# Patient Record
Sex: Female | Born: 1990 | Race: Black or African American | Hispanic: No | Marital: Single | Smoking: Never smoker
Health system: Southern US, Community
[De-identification: ages and names within clinical notes are randomized; demographics above are authoritative.]

## PROBLEM LIST (undated history)

## (undated) ENCOUNTER — Inpatient Hospital Stay (HOSPITAL_COMMUNITY): Payer: Self-pay

## (undated) DIAGNOSIS — Z789 Other specified health status: Secondary | ICD-10-CM

## (undated) DIAGNOSIS — E119 Type 2 diabetes mellitus without complications: Secondary | ICD-10-CM

## (undated) DIAGNOSIS — B9689 Other specified bacterial agents as the cause of diseases classified elsewhere: Secondary | ICD-10-CM

## (undated) DIAGNOSIS — N760 Acute vaginitis: Secondary | ICD-10-CM

## (undated) HISTORY — PX: NO PAST SURGERIES: SHX2092

---
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
## 2012-02-01 ENCOUNTER — Emergency Department (HOSPITAL_COMMUNITY)
Admission: EM | Admit: 2012-02-01 | Discharge: 2012-02-01 | Disposition: A | Discharge status: Home / Self Care | Payer: Medicaid Other | Attending: Emergency Medicine | Admitting: Emergency Medicine

## 2012-02-01 ENCOUNTER — Encounter (HOSPITAL_COMMUNITY): Payer: Self-pay | Admitting: Emergency Medicine

## 2012-02-01 DIAGNOSIS — R509 Fever, unspecified: Secondary | ICD-10-CM | POA: Insufficient documentation

## 2012-02-01 DIAGNOSIS — R52 Pain, unspecified: Secondary | ICD-10-CM | POA: Insufficient documentation

## 2012-02-01 DIAGNOSIS — J020 Streptococcal pharyngitis: Secondary | ICD-10-CM

## 2012-02-01 LAB — RAPID STREP SCREEN (MED CTR MEBANE ONLY): Streptococcus, Group A Screen (Direct): POSITIVE — AB

## 2012-02-01 MED ORDER — AMOXICILLIN 500 MG PO CAPS: 500.0000 mg | ORAL_CAPSULE | Freq: Three times a day (TID) | ORAL | Status: DC

## 2012-02-01 NOTE — ED Provider Notes (Signed)
History     CSN: [id number]  Arrival date & time 02/01/12  1929   First MD Initiated Contact with Patient 02/01/12 1947      Chief Complaint  Patient presents with  . Sore Throat  . Generalized Body Aches  . Fever    (Consider location/radiation/quality/duration/timing/severity/associated sxs/prior treatment) HPI Comments: This is a 21 year old female who presents emergency department with chief complaint of fever, sore throat, and body aches since yesterday. Patient states that she has taken DayQuil with little relief. Her symptoms are worsening. She states that her pain is moderate. The pain is worsened with swallowing.  The history is provided by the patient. No language interpreter was used.    History reviewed. No pertinent past medical history.  History reviewed. No pertinent past surgical history.  History reviewed. No pertinent family history.  History  Substance Use Topics  . Smoking status: Never Smoker   . Smokeless tobacco: Not on file  . Alcohol Use: No    OB History    Grav Para Term Preterm Abortions TAB SAB Ect Mult Living                  Review of Systems  All other systems reviewed and are negative.    Allergies  Review of patient's allergies indicates no known allergies.  Home Medications   Current Outpatient Rx  Name  Route  Sig  Dispense  Refill  . DAYQUIL MULTI-SYMPTOM COLD/FLU PO   Oral   Take 1 tablet by mouth 2 (two) times daily as needed. Flu-like symptoms           BP 103/37  Pulse 129  Temp 101.1 F (38.4 C) (Oral)  Resp 18  SpO2 100%  Physical Exam  Nursing note and vitals reviewed. Constitutional: She is oriented to person, place, and time. She appears well-developed and well-nourished.  HENT:  Head: Normocephalic and atraumatic.       Red and inflamed oropharynx, no signs of tonsillar or peritonsillar abscess, uvula is midline, no signs of tonsillar exudates.  Eyes: Conjunctivae normal and EOM are normal. Pupils  are equal, round, and reactive to light.  Neck: Normal range of motion. Neck supple.  Cardiovascular: Normal rate and regular rhythm.  Exam reveals no gallop and no friction rub.   No murmur heard. Pulmonary/Chest: Effort normal and breath sounds normal. No respiratory distress. She has no wheezes. She has no rales. She exhibits no tenderness.  Abdominal: Soft. Bowel sounds are normal. She exhibits no distension and no mass. There is no tenderness. There is no rebound and no guarding.  Musculoskeletal: Normal range of motion. She exhibits no edema and no tenderness.  Neurological: She is alert and oriented to person, place, and time.  Skin: Skin is warm and dry.  Psychiatric: She has a normal mood and affect. Her behavior is normal. Judgment and thought content normal.    ED Course  Procedures (including critical care time)  Labs Reviewed  RAPID STREP SCREEN - Abnormal; Notable for the following:    Streptococcus, Group A Screen (Direct) POSITIVE (*)     All other components within normal limits     1. Strep pharyngitis       MDM  21 year old female with strep throat. I will treat the patient with amoxicillin and discharged patient to home. She may use Tylenol and ibuprofen to manage her pain. Patient is agreeable with this. She is stable and ready for discharge.   1. Amoxicillin  Roxy Horseman, PA-C 02/01/12 [phone]

## 2012-02-01 NOTE — ED Notes (Signed)
Patient reports that she started to have body aches, sore throat and heasdache yesterday. To day quil today

## 2012-02-02 NOTE — ED Provider Notes (Signed)
Medical screening examination/treatment/procedure(s) were performed by non-physician practitioner and as supervising physician I was immediately available for consultation/collaboration.  Deanta Mincey, MD 02/02/12 0005 

## 2012-03-28 ENCOUNTER — Emergency Department (INDEPENDENT_AMBULATORY_CARE_PROVIDER_SITE_OTHER)
Admission: EM | Admit: 2012-03-28 | Discharge: 2012-03-28 | Disposition: A | Discharge status: Nursing Home (Includes ICF) | Payer: Medicaid Other | Source: Home / Self Care | Attending: Family Medicine | Admitting: Family Medicine

## 2012-03-28 ENCOUNTER — Encounter (HOSPITAL_COMMUNITY): Payer: Self-pay | Admitting: Emergency Medicine

## 2012-03-28 ENCOUNTER — Inpatient Hospital Stay (HOSPITAL_COMMUNITY)
Admission: EM | Admit: 2012-03-28 | Discharge: 2012-03-31 | DRG: 690 | Disposition: A | Discharge status: Home / Self Care | Payer: Medicaid Other | Attending: Internal Medicine | Admitting: Internal Medicine

## 2012-03-28 ENCOUNTER — Other Ambulatory Visit (HOSPITAL_COMMUNITY)
Admission: RE | Admit: 2012-03-28 | Discharge: 2012-03-28 | Disposition: A | Discharge status: Home / Self Care | Payer: Medicaid Other | Source: Ambulatory Visit | Attending: Family Medicine | Admitting: Family Medicine

## 2012-03-28 ENCOUNTER — Emergency Department (INDEPENDENT_AMBULATORY_CARE_PROVIDER_SITE_OTHER): Payer: Medicaid Other

## 2012-03-28 DIAGNOSIS — R1031 Right lower quadrant pain: Secondary | ICD-10-CM

## 2012-03-28 DIAGNOSIS — N10 Acute tubulo-interstitial nephritis: Principal | ICD-10-CM | POA: Diagnosis present

## 2012-03-28 DIAGNOSIS — N760 Acute vaginitis: Secondary | ICD-10-CM | POA: Insufficient documentation

## 2012-03-28 DIAGNOSIS — Z113 Encounter for screening for infections with a predominantly sexual mode of transmission: Secondary | ICD-10-CM | POA: Insufficient documentation

## 2012-03-28 DIAGNOSIS — A498 Other bacterial infections of unspecified site: Secondary | ICD-10-CM | POA: Diagnosis present

## 2012-03-28 DIAGNOSIS — N390 Urinary tract infection, site not specified: Secondary | ICD-10-CM

## 2012-03-28 DIAGNOSIS — Z79899 Other long term (current) drug therapy: Secondary | ICD-10-CM

## 2012-03-28 DIAGNOSIS — N12 Tubulo-interstitial nephritis, not specified as acute or chronic: Secondary | ICD-10-CM

## 2012-03-28 DIAGNOSIS — N151 Renal and perinephric abscess: Secondary | ICD-10-CM | POA: Diagnosis present

## 2012-03-28 LAB — URINALYSIS, ROUTINE W REFLEX MICROSCOPIC
Bilirubin Urine: NEGATIVE
Glucose, UA: NEGATIVE mg/dL
Ketones, ur: 15 mg/dL — AB
Nitrite: POSITIVE — AB
Protein, ur: NEGATIVE mg/dL
Specific Gravity, Urine: 1.017 (ref 1.005–1.030)
Urobilinogen, UA: 0.2 mg/dL (ref 0.0–1.0)
pH: 5.5 (ref 5.0–8.0)

## 2012-03-28 LAB — POCT URINALYSIS DIP (DEVICE)
Bilirubin Urine: NEGATIVE
Glucose, UA: NEGATIVE mg/dL
Ketones, ur: NEGATIVE mg/dL
Nitrite: NEGATIVE
Protein, ur: 30 mg/dL — AB
Specific Gravity, Urine: 1.015 (ref 1.005–1.030)
Urobilinogen, UA: 1 mg/dL (ref 0.0–1.0)
pH: 6 (ref 5.0–8.0)

## 2012-03-28 LAB — POCT PREGNANCY, URINE
Preg Test, Ur: NEGATIVE
Preg Test, Ur: NEGATIVE

## 2012-03-28 LAB — CBC WITH DIFFERENTIAL/PLATELET
Basophils Absolute: 0 10*3/uL (ref 0.0–0.1)
Basophils Relative: 0 % (ref 0–1)
Eosinophils Absolute: 0 10*3/uL (ref 0.0–0.7)
Eosinophils Relative: 0 % (ref 0–5)
HCT: 40.1 % (ref 36.0–46.0)
Hemoglobin: 14.1 g/dL (ref 12.0–15.0)
Lymphocytes Relative: 15 % (ref 12–46)
Lymphs Abs: 1.5 10*3/uL (ref 0.7–4.0)
MCH: 30 pg (ref 26.0–34.0)
MCHC: 35.2 g/dL (ref 30.0–36.0)
MCV: 85.3 fL (ref 78.0–100.0)
Monocytes Absolute: 0.7 10*3/uL (ref 0.1–1.0)
Monocytes Relative: 7 % (ref 3–12)
Neutro Abs: 7.8 10*3/uL — ABNORMAL HIGH (ref 1.7–7.7)
Neutrophils Relative %: 79 % — ABNORMAL HIGH (ref 43–77)
Platelets: 219 10*3/uL (ref 150–400)
RBC: 4.7 MIL/uL (ref 3.87–5.11)
RDW: 12.8 % (ref 11.5–15.5)
WBC: 10 10*3/uL (ref 4.0–10.5)

## 2012-03-28 LAB — COMPREHENSIVE METABOLIC PANEL
ALT: 10 U/L (ref 0–35)
AST: 16 U/L (ref 0–37)
Albumin: 3.8 g/dL (ref 3.5–5.2)
Alkaline Phosphatase: 76 U/L (ref 39–117)
BUN: 5 mg/dL — ABNORMAL LOW (ref 6–23)
CO2: 25 mEq/L (ref 19–32)
Calcium: 9.6 mg/dL (ref 8.4–10.5)
Chloride: 104 mEq/L (ref 96–112)
Creatinine, Ser: 0.84 mg/dL (ref 0.50–1.10)
GFR calc Af Amer: >90 mL/min (ref >90)
GFR calc non Af Amer: >90 mL/min (ref >90)
Glucose, Bld: 123 mg/dL — ABNORMAL HIGH (ref 70–99)
Potassium: 3.4 mEq/L — ABNORMAL LOW (ref 3.5–5.1)
Sodium: 139 mEq/L (ref 135–145)
Total Bilirubin: 0.5 mg/dL (ref 0.3–1.2)
Total Protein: 8 g/dL (ref 6.0–8.3)

## 2012-03-28 LAB — URINE MICROSCOPIC-ADD ON

## 2012-03-28 MED ORDER — MORPHINE SULFATE 4 MG/ML IJ SOLN
4.0000 mg | Freq: Once | INTRAMUSCULAR | Status: AC
  Administered 2012-03-28: 4 mg via INTRAVENOUS
  Filled 2012-03-28: qty 1

## 2012-03-28 MED ORDER — IOHEXOL 300 MG/ML  SOLN
50.0000 mL | Freq: Once | INTRAMUSCULAR | Status: AC | PRN
  Administered 2012-03-28: 50 mL via ORAL

## 2012-03-28 MED ORDER — HYDROMORPHONE HCL PF 1 MG/ML IJ SOLN
1.0000 mg | Freq: Once | INTRAMUSCULAR | Status: AC
  Administered 2012-03-29: 1 mg via INTRAVENOUS
  Filled 2012-03-28: qty 1

## 2012-03-28 MED ORDER — DEXTROSE 5 % IV SOLN
1.0000 g | Freq: Once | INTRAVENOUS | Status: AC
  Administered 2012-03-28: 1 g via INTRAVENOUS
  Filled 2012-03-28: qty 10

## 2012-03-28 MED ORDER — SODIUM CHLORIDE 0.9 % IV BOLUS (SEPSIS)
1000.0000 mL | Freq: Once | INTRAVENOUS | Status: AC
  Administered 2012-03-28: 1000 mL via INTRAVENOUS

## 2012-03-28 NOTE — ED Notes (Signed)
Pt sent to ED from Urgent Care with c/o RLQ pain x's 2 days.  Pt denies nausea or vomiting, no diarrhea.  Pt also denies urinary symptoms or vag. Discharge.

## 2012-03-28 NOTE — ED Provider Notes (Signed)
History     CSN: [id number]  Arrival date & time 03/28/12  1730   None     Chief Complaint  Patient presents with  . Abdominal Pain    (Consider location/radiation/quality/duration/timing/severity/associated sxs/prior treatment) HPI History provided by pt.  Pt referred from [facility] for further evaluation of right side pain.  Onset 2 days ago, constant, radiates to right flank, associated w/ anorexia and nausea.  Denies fever, change in bowels, GU sx.  No h/o abd surgeries.    History reviewed. No pertinent past medical history.  History reviewed. No pertinent past surgical history.  No family history on file.  History  Substance Use Topics  . Smoking status: Never Smoker   . Smokeless tobacco: Not on file  . Alcohol Use: No    OB History    Grav Para Term Preterm Abortions TAB SAB Ect Mult Living                  Review of Systems  All other systems reviewed and are negative.    Allergies  Review of patient's allergies indicates no known allergies.  Home Medications   Current Outpatient Rx  Name  Route  Sig  Dispense  Refill  . ACETAMINOPHEN 325 MG PO TABS   Oral   Take 650 mg by mouth every 6 (six) hours as needed. For pain         . DEPO-PROVERA IM   Intramuscular   Inject into the muscle.           BP 123/71  Pulse 91  Temp 99.7 F (37.6 C) (Oral)  Resp 16  SpO2 98%  Physical Exam  Nursing note and vitals reviewed. Constitutional: She is oriented to person, place, and time. She appears well-developed and well-nourished. No distress.  HENT:  Head: Normocephalic and atraumatic.  Eyes:       Normal appearance  Neck: Normal range of motion.  Cardiovascular: Normal rate and regular rhythm.   Pulmonary/Chest: Effort normal and breath sounds normal. No respiratory distress.  Abdominal: Soft. Bowel sounds are normal. She exhibits no distension and no mass. There is no rebound and no guarding.       RLQ ttp w/ possible peritoneal signs (pain w/  tapping on abd and shaking hips)  Genitourinary:       R CVA tenderness  Musculoskeletal: Normal range of motion.  Neurological: She is alert and oriented to person, place, and time.  Skin: Skin is warm and dry. No rash noted.  Psychiatric: She has a normal mood and affect. Her behavior is normal.    ED Course  Procedures (including critical care time)  Labs Reviewed  URINALYSIS, ROUTINE W REFLEX MICROSCOPIC - Abnormal; Notable for the following:    APPearance CLOUDY (*)     Hgb urine dipstick MODERATE (*)     Ketones, ur 15 (*)     Nitrite POSITIVE (*)     Leukocytes, UA MODERATE (*)     All other components within normal limits  CBC WITH DIFFERENTIAL - Abnormal; Notable for the following:    Neutrophils Relative 79 (*)     Neutro Abs 7.8 (*)     All other components within normal limits  COMPREHENSIVE METABOLIC PANEL - Abnormal; Notable for the following:    Potassium 3.4 (*)     Glucose, Bld 123 (*)     BUN 5 (*)     All other components within normal limits  URINE MICROSCOPIC-ADD ON - Abnormal;  Notable for the following:    Squamous Epithelial / LPF FEW (*)     Bacteria, UA MANY (*)     All other components within normal limits  POCT PREGNANCY, URINE  URINE CULTURE   Dg Abd 1 View  03/28/2012  *RADIOLOGY REPORT*  Clinical Data: Right lower quadrant abdominal pain for 2 days.  ABDOMEN - 1 VIEW  Comparison: None.  Findings: Bowel gas pattern is normal.  No abnormal calcifications or bony findings.  Naval piercing incidentally noted.  IMPRESSION: Unremarkable one-view radiograph.   Original Report Authenticated By: Paulina Fusi, M.D.    Ct Abdomen Pelvis W Contrast  03/29/2012  *RADIOLOGY REPORT*  Clinical Data: Right lower quadrant abdominal pain  CT ABDOMEN AND PELVIS WITH CONTRAST  Technique:  Multidetector CT imaging of the abdomen and pelvis was performed following the standard protocol during bolus administration of intravenous contrast.  Contrast: OMNIPAQUE  IOHEXOL 300 MG/ML  SOLN  Comparison: 03/28/2012 radiograph  Findings: Linear opacity within the left lower lobe, favor atelectasis.  Normal heart size.  Unremarkable liver, biliary system, spleen, pancreas, adrenal glands, left kidney.  No hydronephrosis or hydroureter.  Arising within the lower pole right kidney, there is a 1.6 cm peripherally enhancing lesion as seen on coronal image 36.  There is associated perinephric fat stranding / ill-defined fluid.  No CT evidence for colitis.  The appendix is distended up to 11 mm. An there is air and fecal debris within the appendix, including air within the tip.  There is mild stranding adjacent to the tip of the appendix however this is favored to track inferiorly from the renal process described above.  No bowel obstruction.  No pathologically enlarged lymph nodes. No free intraperitoneal air.  No acute osseous finding.  Normal caliber aorta and branch vessels.  Circumferential bladder wall thickening.  Nonspecific appearance to the uterus.  No adnexal mass.  IMPRESSION: Ill-defined peripherally enhancing lesion within the lower pole right kidney, with adjacent perinephric fat stranding and fluid. This is favored to reflect pyelonephritis with developing renal abscess.  Correlate with urinalysis.  The appendix is distended up to 11 mm.  Minimal periappendiceal fat stranding / fluid is favored to track inferiorly from the renal process described above. While disfavored, it is not possible to exclude appendicitis in this setting.   Original Report Authenticated By: Jearld Lesch, M.D.      1. Pyelonephritis       MDM  21yo healthy F sent from urgent care for evaluation of right side pain.  Labs sig for UTI.  D/t c/o anorexia and nausea, as well as RLQ ttp w/ signs of possible peritonitis, CT abd/pelvis ordered to r/o appendicitis.   Pt receiving IVF, rocephin and morphine.  10:34 PM   Pain currently controlled w/ dilaudid.  On repeat exam, pt continues to  be tender in RLQ.  CT shows pyelo w/ possible 1.5cm renal abscess, and appendicitis can not be excluded.  General surgery plan to look at her CT and will likely see her in consult.  Recommended starting IV invanz.   [facility] to admit.  4:08 AM       Otilio Miu, PA-C 03/29/12 [phone]

## 2012-03-28 NOTE — ED Provider Notes (Signed)
History     CSN: [id number]  Arrival date & time 03/28/12  1521   First MD Initiated Contact with Patient 03/28/12 1543      Chief Complaint  Patient presents with  . Abdominal Pain    (Consider location/radiation/quality/duration/timing/severity/associated sxs/prior treatment) HPI Comments: 22 year old female with history of strep tonsillitis and impetigo in the past . Here with mother concerned about right lower pain for 2 days. Symptoms associated with vaginal discharge for 2-3 days. Last bowel movement 3 days ago soft brown. On Depo-Provera does not have periods. Denies prior history of sexually transmitted diseases. Symptoms associated with decreased appetite but no nausea or vomiting. Patient has felt chills. Denies dysuria.    History reviewed. No pertinent past medical history.  History reviewed. No pertinent past surgical history.  No family history on file.  History  Substance Use Topics  . Smoking status: Never Smoker   . Smokeless tobacco: Not on file  . Alcohol Use: No    OB History    Grav Para Term Preterm Abortions TAB SAB Ect Mult Living                  Review of Systems  Constitutional: Positive for chills and appetite change. Negative for fever.  HENT: Negative for congestion and sore throat.   Respiratory: Negative for cough.   Cardiovascular: Negative for chest pain.  Gastrointestinal: Positive for abdominal pain. Negative for nausea and vomiting.  Genitourinary: Positive for flank pain and vaginal discharge. Negative for dysuria, frequency, hematuria, vaginal bleeding, difficulty urinating, genital sores and pelvic pain.  Musculoskeletal: Negative for back pain.  Skin: Negative for rash.  Neurological: Negative for dizziness and headaches.    Allergies  Review of patient's allergies indicates no known allergies.  Home Medications   Current Outpatient Rx  Name  Route  Sig  Dispense  Refill  . ACETAMINOPHEN 325 MG PO TABS   Oral   Take  650 mg by mouth every 6 (six) hours as needed.         Marland Kitchen DEPO-PROVERA IM   Intramuscular   Inject into the muscle.         Marland Kitchen AMOXICILLIN 500 MG PO CAPS   Oral   Take 1 capsule (500 mg total) by mouth 3 (three) times daily.   30 capsule   0   . DAYQUIL MULTI-SYMPTOM COLD/FLU PO   Oral   Take 1 tablet by mouth 2 (two) times daily as needed. Flu-like symptoms           BP 101/69  Pulse 118  Temp 99.2 F (37.3 C) (Oral)  Resp 16  Ht 5\' 4"  (1.626 m)  Wt 146 lb (66.225 kg)  BMI 25.06 kg/m2  SpO2 100%  Physical Exam  Nursing note and vitals reviewed. Constitutional: She is oriented to person, place, and time. She appears well-developed and well-nourished. No distress.  HENT:  Head: Normocephalic and atraumatic.  Mouth/Throat: Oropharynx is clear and moist. No oropharyngeal exudate.  Eyes: Conjunctivae normal are normal. No scleral icterus.  Neck: Neck supple. No thyromegaly present.  Cardiovascular: Normal heart sounds.   Pulmonary/Chest: Breath sounds normal.  Abdominal: Soft. She exhibits no distension. Hernia confirmed negative in the right inguinal area and confirmed negative in the left inguinal area.       Normal to increased bowel sounds. There is right flank tenderness with deep palpation. There is focalized right lower and tenderness with guarding. No rebound.  Genitourinary: Uterus normal. Uterus is not  enlarged and not tender. Cervix exhibits no motion tenderness. Right adnexum displays no mass, no tenderness and no fullness. Left adnexum displays no mass, no tenderness and no fullness. No bleeding around the vagina. Vaginal discharge found.  Lymphadenopathy:    She has no cervical adenopathy.       Right: No inguinal adenopathy present.       Left: No inguinal adenopathy present.  Neurological: She is alert and oriented to person, place, and time.  Skin: She is not diaphoretic.    ED Course  Procedures (including critical care time)  Labs Reviewed   POCT URINALYSIS DIP (DEVICE) - Abnormal; Notable for the following:    Hgb urine dipstick MODERATE (*)     Protein, ur 30 (*)     Leukocytes, UA SMALL (*)  Biochemical Testing Only. Please order routine urinalysis from main lab if confirmatory testing is needed.   All other components within normal limits  POCT PREGNANCY, URINE  CERVICOVAGINAL ANCILLARY ONLY   Dg Abd 1 View  03/28/2012  *RADIOLOGY REPORT*  Clinical Data: Right lower quadrant abdominal pain for 2 days.  ABDOMEN - 1 VIEW  Comparison: None.  Findings: Bowel gas pattern is normal.  No abnormal calcifications or bony findings.  Naval piercing incidentally noted.  IMPRESSION: Unremarkable one-view radiograph.   Original Report Authenticated By: Paulina Fusi, M.D.      1. RLQ abdominal pain       MDM   22 year old female here with right lower quadrant pain for 2 days. Symptoms associated to lack of appetite and vaginal discharge. On examination: Temperature is 99.2 F normal blood pressure. Patient looks uncomfortable. There is guarding and tenderness to palpation at right lower quadrant. No rebound. Also tenderness to deep palpation in left flank. Bowel sounds are normal. Pelvic examination with vaginal discharge and no tenderness to cervical motion. Urinalysis with moderate hematuria and small LE and negative for nitrates. KUB normal. Concerned about nonclassic presentation of appendicitis vs phlegmon vs nephrolithiasis, decided to transfer to the emergency department for further evaluation and management. No cbc or blood test has been done in urgent care Center. urine culture, GC/CHL and wet prep results pending prior to transfer to the emergency department.         Sharin Grave, MD 03/28/12 1710

## 2012-03-28 NOTE — ED Notes (Signed)
Pain in right side, radiating into back.  Onset 2 days ago.  No known injury

## 2012-03-28 NOTE — ED Notes (Signed)
Transferred two lab samples to the ucc lab, samples labeled by dr Alfonse Ras

## 2012-03-28 NOTE — ED Notes (Signed)
Mother also concerned for areas of impetigo.

## 2012-03-28 NOTE — ED Notes (Addendum)
Pt c/o RLQ pain and urinary frequency that started 2 day ago. Pt denies n/v/d. Pt denies burning with urination. Pt in nad. Ambulatory to room. Pt denies worsening pain after pressure taken off off RLQ.

## 2012-03-29 ENCOUNTER — Encounter (HOSPITAL_COMMUNITY): Payer: Self-pay | Admitting: *Deleted

## 2012-03-29 ENCOUNTER — Emergency Department (HOSPITAL_COMMUNITY): Payer: Medicaid Other

## 2012-03-29 DIAGNOSIS — R1031 Right lower quadrant pain: Secondary | ICD-10-CM | POA: Diagnosis present

## 2012-03-29 DIAGNOSIS — N390 Urinary tract infection, site not specified: Secondary | ICD-10-CM | POA: Diagnosis present

## 2012-03-29 DIAGNOSIS — R109 Unspecified abdominal pain: Secondary | ICD-10-CM

## 2012-03-29 DIAGNOSIS — N12 Tubulo-interstitial nephritis, not specified as acute or chronic: Secondary | ICD-10-CM

## 2012-03-29 DIAGNOSIS — R52 Pain, unspecified: Secondary | ICD-10-CM

## 2012-03-29 MED ORDER — HYDROMORPHONE HCL PF 1 MG/ML IJ SOLN
1.0000 mg | INTRAMUSCULAR | Status: DC | PRN
  Administered 2012-03-29 – 2012-03-31 (×3): 1 mg via INTRAVENOUS
  Filled 2012-03-29 (×3): qty 1

## 2012-03-29 MED ORDER — IOHEXOL 300 MG/ML  SOLN
100.0000 mL | Freq: Once | INTRAMUSCULAR | Status: AC | PRN
  Administered 2012-03-29: 100 mL via INTRAVENOUS

## 2012-03-29 MED ORDER — HYDROMORPHONE HCL PF 1 MG/ML IJ SOLN: 1.0000 mg | INTRAMUSCULAR | Status: DC | PRN

## 2012-03-29 MED ORDER — POTASSIUM CHLORIDE IN NACL 20-0.9 MEQ/L-% IV SOLN
INTRAVENOUS | Status: AC
  Administered 2012-03-29 (×2): via INTRAVENOUS
  Filled 2012-03-29 (×2): qty 1000

## 2012-03-29 MED ORDER — SODIUM CHLORIDE 0.9 % IV SOLN
1.0000 g | Freq: Every day | INTRAVENOUS | Status: DC
  Administered 2012-03-29 – 2012-03-30 (×2): 1 g via INTRAVENOUS
  Filled 2012-03-29 (×3): qty 1

## 2012-03-29 MED ORDER — ERTAPENEM SODIUM 1 G IJ SOLR
1.0000 g | Freq: Once | INTRAMUSCULAR | Status: AC
  Administered 2012-03-29: 1 g via INTRAVENOUS
  Filled 2012-03-29: qty 1

## 2012-03-29 MED ORDER — ONDANSETRON HCL 4 MG PO TABS: 4.0000 mg | ORAL_TABLET | Freq: Four times a day (QID) | ORAL | Status: DC | PRN

## 2012-03-29 MED ORDER — ONDANSETRON HCL 4 MG/2ML IJ SOLN: 4.0000 mg | Freq: Three times a day (TID) | INTRAMUSCULAR | Status: DC | PRN

## 2012-03-29 MED ORDER — ONDANSETRON HCL 4 MG/2ML IJ SOLN: 4.0000 mg | Freq: Four times a day (QID) | INTRAMUSCULAR | Status: DC | PRN

## 2012-03-29 NOTE — Consult Note (Signed)
Reason for Consult: right flank pain and possible appendicitis Referring Physician: Ruby Cola, PA-C  Kristen [name] is an 22 y.o. female.  HPI: Patient started having right side pain as described by her on Monday, no nausea or vomiting, but did have anorexia and continues to have no appetite.  No bow ell movement since Monday.  Came to the ED because the pain is worse.    CT and laboratory studies indicate that she has a significant right pyelonephritis with a right peri-nephric abscess which extends towards the appendix on the right leading to some peri-appendiceal stranding.  The appendix is also large at 11mm, but fills with air and stool contents to the near tip.  History reviewed. No pertinent past medical history.  History reviewed. No pertinent past surgical history.  History reviewed. No pertinent family history.  Social History:  reports that she has never smoked. She does not have any smokeless tobacco history on file. She reports that she does not drink alcohol or use illicit drugs.  Allergies: No Known Allergies  Medications: I have reviewed the patient's current medications.  Results for orders placed during the hospital encounter of 03/28/12 (from the past 48 hour(s))  CBC WITH DIFFERENTIAL     Status: Abnormal   Collection Time   03/28/12  6:56 PM      Component Value Range Comment   WBC 10.0  4.0 - 10.5 K/uL    RBC 4.70  3.87 - 5.11 MIL/uL    Hemoglobin 14.1  12.0 - 15.0 g/dL    HCT 16.1  09.6 - 04.5 %    MCV 85.3  78.0 - 100.0 fL    MCH 30.0  26.0 - 34.0 pg    MCHC 35.2  30.0 - 36.0 g/dL    RDW 40.9  81.1 - 91.4 %    Platelets 219  150 - 400 K/uL    Neutrophils Relative 79 (*) 43 - 77 %    Neutro Abs 7.8 (*) 1.7 - 7.7 K/uL    Lymphocytes Relative 15  12 - 46 %    Lymphs Abs 1.5  0.7 - 4.0 K/uL    Monocytes Relative 7  3 - 12 %    Monocytes Absolute 0.7  0.1 - 1.0 K/uL    Eosinophils Relative 0  0 - 5 %    Eosinophils Absolute 0.0  0.0 - 0.7 K/uL     Basophils Relative 0  0 - 1 %    Basophils Absolute 0.0  0.0 - 0.1 K/uL   COMPREHENSIVE METABOLIC PANEL     Status: Abnormal   Collection Time   03/28/12  6:56 PM      Component Value Range Comment   Sodium 139  135 - 145 mEq/L    Potassium 3.4 (*) 3.5 - 5.1 mEq/L    Chloride 104  96 - 112 mEq/L    CO2 25  19 - 32 mEq/L    Glucose, Bld 123 (*) 70 - 99 mg/dL    BUN 5 (*) 6 - 23 mg/dL    Creatinine, Ser 7.82  0.50 - 1.10 mg/dL    Calcium 9.6  8.4 - 95.6 mg/dL    Total Protein 8.0  6.0 - 8.3 g/dL    Albumin 3.8  3.5 - 5.2 g/dL    AST 16  0 - 37 U/L    ALT 10  0 - 35 U/L    Alkaline Phosphatase 76  39 - 117 U/L    Total  Bilirubin 0.5  0.3 - 1.2 mg/dL    GFR calc non Af Amer >90  >90 mL/min    GFR calc Af Amer >90  >90 mL/min   URINALYSIS, ROUTINE W REFLEX MICROSCOPIC     Status: Abnormal   Collection Time   03/28/12  7:02 PM      Component Value Range Comment   Color, Urine YELLOW  YELLOW    APPearance CLOUDY (*) CLEAR    Specific Gravity, Urine 1.017  1.005 - 1.030    pH 5.5  5.0 - 8.0    Glucose, UA NEGATIVE  NEGATIVE mg/dL    Hgb urine dipstick MODERATE (*) NEGATIVE    Bilirubin Urine NEGATIVE  NEGATIVE    Ketones, ur 15 (*) NEGATIVE mg/dL    Protein, ur NEGATIVE  NEGATIVE mg/dL    Urobilinogen, UA 0.2  0.0 - 1.0 mg/dL    Nitrite POSITIVE (*) NEGATIVE    Leukocytes, UA MODERATE (*) NEGATIVE   URINE MICROSCOPIC-ADD ON     Status: Abnormal   Collection Time   03/28/12  7:02 PM      Component Value Range Comment   Squamous Epithelial / LPF FEW (*) RARE    WBC, UA 21-50  <3 WBC/hpf    RBC / HPF 7-10  <3 RBC/hpf    Bacteria, UA MANY (*) RARE    Urine-Other MUCOUS PRESENT     POCT PREGNANCY, URINE     Status: Normal   Collection Time   03/28/12  7:14 PM      Component Value Range Comment   Preg Test, Ur NEGATIVE  NEGATIVE     Dg Abd 1 View  03/28/2012  *RADIOLOGY REPORT*  Clinical Data: Right lower quadrant abdominal pain for 2 days.  ABDOMEN - 1 VIEW  Comparison: None.   Findings: Bowel gas pattern is normal.  No abnormal calcifications or bony findings.  Naval piercing incidentally noted.  IMPRESSION: Unremarkable one-view radiograph.   Original Report Authenticated By: Paulina Fusi, M.D.    Ct Abdomen Pelvis W Contrast  03/29/2012  *RADIOLOGY REPORT*  Clinical Data: Right lower quadrant abdominal pain  CT ABDOMEN AND PELVIS WITH CONTRAST  Technique:  Multidetector CT imaging of the abdomen and pelvis was performed following the standard protocol during bolus administration of intravenous contrast.  Contrast: OMNIPAQUE IOHEXOL 300 MG/ML  SOLN  Comparison: 03/28/2012 radiograph  Findings: Linear opacity within the left lower lobe, favor atelectasis.  Normal heart size.  Unremarkable liver, biliary system, spleen, pancreas, adrenal glands, left kidney.  No hydronephrosis or hydroureter.  Arising within the lower pole right kidney, there is a 1.6 cm peripherally enhancing lesion as seen on coronal image 36.  There is associated perinephric fat stranding / ill-defined fluid.  No CT evidence for colitis.  The appendix is distended up to 11 mm. An there is air and fecal debris within the appendix, including air within the tip.  There is mild stranding adjacent to the tip of the appendix however this is favored to track inferiorly from the renal process described above.  No bowel obstruction.  No pathologically enlarged lymph nodes. No free intraperitoneal air.  No acute osseous finding.  Normal caliber aorta and branch vessels.  Circumferential bladder wall thickening.  Nonspecific appearance to the uterus.  No adnexal mass.  IMPRESSION: Ill-defined peripherally enhancing lesion within the lower pole right kidney, with adjacent perinephric fat stranding and fluid. This is favored to reflect pyelonephritis with developing renal abscess.  Correlate with urinalysis.  The appendix is distended up to 11 mm.  Minimal periappendiceal fat stranding / fluid is favored to track inferiorly  from the renal process described above. While disfavored, it is not possible to exclude appendicitis in this setting.   Original Report Authenticated By: Jearld Lesch, M.D.     Review of Systems  Constitutional: Positive for fever and chills.  HENT: Negative.   Eyes: Negative.   Respiratory: Negative.   Cardiovascular: Negative.   Gastrointestinal: Positive for abdominal pain and constipation. Negative for nausea, vomiting and blood in stool.  Genitourinary: Positive for flank pain (right). Negative for dysuria, urgency and frequency.  Musculoskeletal: Negative.   Skin: Negative.   Neurological: Negative.   Endo/Heme/Allergies: Negative.   Psychiatric/Behavioral: Negative.    Blood pressure 105/59, pulse 74, temperature 98.4 F (36.9 C), temperature source Oral, resp. rate 17, SpO2 99.00%. Physical Exam  Constitutional: She is oriented to person, place, and time. She appears well-developed and well-nourished.  HENT:  Head: Normocephalic and atraumatic.  Eyes: Conjunctivae normal and EOM are normal. Pupils are equal, round, and reactive to light.  Neck: Normal range of motion. Neck supple.  Cardiovascular: Normal rate, regular rhythm and normal heart sounds.   Respiratory: Effort normal and breath sounds normal.  GI: Soft. Normal appearance and bowel sounds are normal. She exhibits no distension. There is no hepatosplenomegaly. There is tenderness (mild to moderate) in the right lower quadrant. There is CVA tenderness (right). There is no rigidity and no guarding.  Musculoskeletal: Normal range of motion.  Neurological: She is alert and oriented to person, place, and time. She has normal reflexes.  Skin: Skin is warm and dry.  Psychiatric: She has a normal mood and affect. Her behavior is normal. Judgment and thought content normal.    Assessment/Plan: I had written a note previously about her CT findings and the likelihood that she does not have appendicitis.  My opinion has  not changed with my examination.  Although she does have some RLQ tenderness, it is not associated with any significant peritoneal signs.  She still does not have an appetite, but she states that she does feel better.  Most of her pain and tenderness is on her right flank and CVA tenderness.  I would continue to treat her with IV antibiotics, and we will continue to assess her for improvement.  Her pain should improve along with her appetite and tenderness.  If there is no consistent improvement then she may need to have her appendix reassessed, possibly with laparoscopic exploration.  Cherylynn Ridges 03/29/2012, 7:31 AM

## 2012-03-29 NOTE — Progress Notes (Signed)
Will recheck labs in AM Wilmon Arms. [name], MD, [facility]  03/29/2012 1:34 PM

## 2012-03-29 NOTE — Progress Notes (Signed)
Subjective: Says she feels better, still a little tender under rib cage on right, and her RLQ.    Objective: Vital signs in last 24 hours: Temp:  [98.1 F (36.7 C)-99.7 F (37.6 C)] 98.1 F (36.7 C) (01/30 0620) Pulse Rate:  [65-118] 65  (01/30 0620) Resp:  [15-21] 18  (01/30 0620) BP: (101-124)/(59-71) 117/66 mmHg (01/30 0620) SpO2:  [98 %-100 %] 100 % (01/30 0620) Weight:  [146 lb (66.225 kg)] 146 lb (66.225 kg) (01/30 0700) Last BM Date: 03/26/12  Intake/Output from previous day:   Intake/Output this shift:    General appearance: alert, cooperative and no distress GI: soft, with some tenderness under mid line of right ribs, and some in the RLQ.  Lab Results:   [facility] 03/28/12 1856  WBC 10.0  HGB 14.1  HCT 40.1  PLT 219    BMET  Basename 03/28/12 1856  NA 139  K 3.4*  CL 104  CO2 25  GLUCOSE 123*  BUN 5*  CREATININE 0.84  CALCIUM 9.6   PT/INR No results found for this basename: LABPROT:2,INR:2 in the last 72 hours   Lab 03/28/12 1856  AST 16  ALT 10  ALKPHOS 76  BILITOT 0.5  PROT 8.0  ALBUMIN 3.8     Lipase  No results found for this basename: lipase     Studies/Results: Dg Abd 1 View  03/28/2012  *RADIOLOGY REPORT*  Clinical Data: Right lower quadrant abdominal pain for 2 days.  ABDOMEN - 1 VIEW  Comparison: None.  Findings: Bowel gas pattern is normal.  No abnormal calcifications or bony findings.  Naval piercing incidentally noted.  IMPRESSION: Unremarkable one-view radiograph.   Original Report Authenticated By: Paulina Fusi, M.D.    Ct Abdomen Pelvis W Contrast  03/29/2012  *RADIOLOGY REPORT*  Clinical Data: Right lower quadrant abdominal pain  CT ABDOMEN AND PELVIS WITH CONTRAST  Technique:  Multidetector CT imaging of the abdomen and pelvis was performed following the standard protocol during bolus administration of intravenous contrast.  Contrast: OMNIPAQUE IOHEXOL 300 MG/ML  SOLN  Comparison: 03/28/2012 radiograph  Findings:  Linear opacity within the left lower lobe, favor atelectasis.  Normal heart size.  Unremarkable liver, biliary system, spleen, pancreas, adrenal glands, left kidney.  No hydronephrosis or hydroureter.  Arising within the lower pole right kidney, there is a 1.6 cm peripherally enhancing lesion as seen on coronal image 36.  There is associated perinephric fat stranding / ill-defined fluid.  No CT evidence for colitis.  The appendix is distended up to 11 mm. An there is air and fecal debris within the appendix, including air within the tip.  There is mild stranding adjacent to the tip of the appendix however this is favored to track inferiorly from the renal process described above.  No bowel obstruction.  No pathologically enlarged lymph nodes. No free intraperitoneal air.  No acute osseous finding.  Normal caliber aorta and branch vessels.  Circumferential bladder wall thickening.  Nonspecific appearance to the uterus.  No adnexal mass.  IMPRESSION: Ill-defined peripherally enhancing lesion within the lower pole right kidney, with adjacent perinephric fat stranding and fluid. This is favored to reflect pyelonephritis with developing renal abscess.  Correlate with urinalysis.  The appendix is distended up to 11 mm.  Minimal periappendiceal fat stranding / fluid is favored to track inferiorly from the renal process described above. While disfavored, it is not possible to exclude appendicitis in this setting.   Original Report Authenticated By: Jearld Lesch, M.D.  Medications:    . ertapenem  1 g Intravenous QHS    Assessment/Plan Acute Pyelonephritis Possible appendicitis  Plan:  She is on antibiotics, Dr. Lindie Spruce does not think she has appendicitis, but he's watching and labs are already ordered for the AM.  We will follow with you.     LOS: 1 day    Tequan Redmon 03/29/2012

## 2012-03-29 NOTE — ED Notes (Signed)
Patient transported to CT 

## 2012-03-29 NOTE — ED Notes (Signed)
Back from CT

## 2012-03-29 NOTE — Progress Notes (Signed)
I have reviewed the patient's CT scan and I have spoken with the PA seeing the patient in the emergency department.  Based on the information given so far the patient has a significant pyelonephritis with a perinephric abscess with some extension towards the appendix.  This clinically should give her right flank pain and possibly some RLQ pain, possibly indistinguishable from appendicitis.  However, the presence of air and fecal material within the appendix is less concerning for acute appendicitis.  If this is early acute appendicitis, antibiotic treatment is okay, but not the standard treatment, but we would see if the patient improved with antibiotics.  We will examine her in the morning.  Marta Lamas. Gae Bon, MD, FACS [phone] [facility]

## 2012-03-29 NOTE — H&P (Signed)
Chief Complaint:  abd pain  HPI: 22 yo female healthy with 2 days of worsening rlq sharp abd pain.  No n/v.  No flank pain.  No dysuria.  Still has appendix.  No diarrhea.  No fevers.    Review of Systems:  O/w neg except per HPI  Past Medical History: none   Medications: Prior to Admission medications   Medication Sig Start Date End Date Taking? Authorizing Provider  acetaminophen (TYLENOL) 325 MG tablet Take 650 mg by mouth every 6 (six) hours as needed. For pain   Yes Historical Provider, MD  MedroxyPROGESTERone Acetate (DEPO-PROVERA IM) Inject into the muscle.   Yes Historical Provider, MD    Allergies:  No Known Allergies  Social History:  reports that she has never smoked. She does not have any smokeless tobacco history on file. She reports that she does not drink alcohol or use illicit drugs.  Family History: Neg  Physical Exam: Filed Vitals:   03/29/12 0155 03/29/12 0255 03/29/12 0315 03/29/12 0400  BP: 113/71 124/67 123/71 110/67  Pulse: 73 76 75 78  Temp: 98.7 F (37.1 C) 98.4 F (36.9 C)    TempSrc: Oral Oral    Resp: 19 16 17 21   SpO2: 99% 98% 98% 99%   General appearance: alert, cooperative and no distress Neck: no JVD and supple, symmetrical, trachea midline Lungs: clear to auscultation bilaterally Heart: regular rate and rhythm, S1, S2 normal, no murmur, click, rub or gallop Abdomen: soft ttp rlq no flank pain nd pos bs no r/g nonacute abd Extremities: extremities normal, atraumatic, no cyanosis or edema Pulses: 2+ and symmetric Skin: Skin color, texture, turgor normal. No rashes or lesions Neurologic: Grossly normal   Labs on Admission:   [facility] 03/28/12 1856  NA 139  K 3.4*  CL 104  CO2 25  GLUCOSE 123*  BUN 5*  CREATININE 0.84  CALCIUM 9.6  MG --  PHOS --    Basename 03/28/12 1856  AST 16  ALT 10  ALKPHOS 76  BILITOT 0.5  PROT 8.0  ALBUMIN 3.8    Basename 03/28/12 1856  WBC 10.0  NEUTROABS 7.8*  HGB 14.1  HCT 40.1   MCV 85.3  PLT 219    Radiological Exams on Admission: Dg Abd 1 View  03/28/2012  *RADIOLOGY REPORT*  Clinical Data: Right lower quadrant abdominal pain for 2 days.  ABDOMEN - 1 VIEW  Comparison: None.  Findings: Bowel gas pattern is normal.  No abnormal calcifications or bony findings.  Naval piercing incidentally noted.  IMPRESSION: Unremarkable one-view radiograph.   Original Report Authenticated By: Paulina Fusi, M.D.    Ct Abdomen Pelvis W Contrast  03/29/2012  *RADIOLOGY REPORT*  Clinical Data: Right lower quadrant abdominal pain  CT ABDOMEN AND PELVIS WITH CONTRAST  Technique:  Multidetector CT imaging of the abdomen and pelvis was performed following the standard protocol during bolus administration of intravenous contrast.  Contrast: OMNIPAQUE IOHEXOL 300 MG/ML  SOLN  Comparison: 03/28/2012 radiograph  Findings: Linear opacity within the left lower lobe, favor atelectasis.  Normal heart size.  Unremarkable liver, biliary system, spleen, pancreas, adrenal glands, left kidney.  No hydronephrosis or hydroureter.  Arising within the lower pole right kidney, there is a 1.6 cm peripherally enhancing lesion as seen on coronal image 36.  There is associated perinephric fat stranding / ill-defined fluid.  No CT evidence for colitis.  The appendix is distended up to 11 mm. An there is air and fecal debris within the appendix,  including air within the tip.  There is mild stranding adjacent to the tip of the appendix however this is favored to track inferiorly from the renal process described above.  No bowel obstruction.  No pathologically enlarged lymph nodes. No free intraperitoneal air.  No acute osseous finding.  Normal caliber aorta and branch vessels.  Circumferential bladder wall thickening.  Nonspecific appearance to the uterus.  No adnexal mass.  IMPRESSION: Ill-defined peripherally enhancing lesion within the lower pole right kidney, with adjacent perinephric fat stranding and fluid. This is  favored to reflect pyelonephritis with developing renal abscess.  Correlate with urinalysis.  The appendix is distended up to 11 mm.  Minimal periappendiceal fat stranding / fluid is favored to track inferiorly from the renal process described above. While disfavored, it is not possible to exclude appendicitis in this setting.   Original Report Authenticated By: Jearld Lesch, M.D.     Assessment/Plan 22 yo female with rlq abd pain ct showing pyelo and/or acute appendicitis  Principal Problem:  *Abdominal pain, acute, right lower quadrant Active Problems:  UTI (lower urinary tract infection)  Cover with invanz.  Surgery already called and will see in am.  Clinically sounds like acute apendicitis.  Will await their recs.  Ivf.  Npo.  Iv pain meds.  nonacute abd.  Xareni Kelch A 03/29/2012, 4:38 AM

## 2012-03-29 NOTE — ED Provider Notes (Signed)
Medical screening examination/treatment/procedure(s) were performed by non-physician practitioner and as supervising physician I was immediately available for consultation/collaboration.  Shelda Jakes, MD 03/29/12 1330

## 2012-03-29 NOTE — ED Notes (Signed)
Pt completed contrast CT notified.

## 2012-03-29 NOTE — ED Notes (Signed)
MD at bedside. 

## 2012-03-29 NOTE — Progress Notes (Signed)
[facility] PROGRESS NOTE  Kristen [name] ZOX:[id number] DOB: [birth year] DOA: 03/28/2012 PCP: Default, Provider, MD  Assessment/Plan: Principal Problem:  *Abdominal pain, acute, right lower quadrant Active Problems:  UTI (lower urinary tract infection)    1. Acute Pyelonephritis: Patient presented with 2 days of right flank/lower abdominal pain, without fever or chills. Urinalysis demonstrated a positive sediment, with pyuria and bacteriuria, consistent with a UTI. Abdominal/pelvic CT scan showed an Ill-defined peripherally enhancing lesion within the lower pole right kidney, with adjacent perinephric fat stranding and fluid, favored to reflect pyelonephritis with developing renal abscess. The appendix is distended up to 11 mm. Minimal periappendiceal fat stranding / fluid is favored to track inferiorly from the renal process described above. Managing with with Invanz day#1 (Had one dose of iv Rocephin in ED on 03/28/12). Patient feels better this AM.Will start clears.  2. Abdominal pain:  This is due to #1 above. Given CT scan findings, suggestive of possible appendiceal pathology, Dr Jimmye Norman was called on surgical consultation, and has opined that patient is unlikely to have acute appendicitis. He has recommended conservative management with iv  Antibiotics, for now. The surgical team will continue to assess her for improvement, and if there is no consistent improvement then she may need to have her appendix reassessed, possibly with laparoscopic exploration.   Code Status: Full Code.  Family Communication:  Disposition Plan: To be determined.    Brief narrative: 22 yo female healthy with 2 days of worsening rlq sharp abd pain. No n/v. No flank pain. No dysuria. Still has appendix. No diarrhea. No fevers.   Consultants:  Dr Jimmye Norman, surgeon.   Procedures:  CT abdomen/pelvis.   Abdominal X-Ray.   Antibiotics:  Rocephin 03/28/12.  Invanz  03/29/12>>>  HPI/Subjective: Feels better today.   Objective: Vital signs in last 24 hours: Temp:  [98.1 F (36.7 C)-99.7 F (37.6 C)] 98.1 F (36.7 C) (01/30 0620) Pulse Rate:  [65-118] 65  (01/30 0620) Resp:  [15-21] 18  (01/30 0620) BP: (101-124)/(59-71) 117/66 mmHg (01/30 0620) SpO2:  [98 %-100 %] 100 % (01/30 0620) Weight:  [66.225 kg (146 lb)] 66.225 kg (146 lb) (01/30 0700) Weight change:  Last BM Date: 03/26/12  Intake/Output from previous day:       Physical Exam: General: Comfortable, alert, communicative, fully oriented, not short of breath at rest.  HEENT:  No clinical pallor, no jaundice, no conjunctival injection or discharge. Hydration is fair.  NECK:  Supple, JVP not seen, no carotid bruits, no palpable lymphadenopathy, no palpable goiter. CHEST:  Clinically clear to auscultation, no wheezes, no crackles. HEART:  Sounds 1 and 2 heard, normal, regular, no murmurs. ABDOMEN:  Flat, soft, mildly tender in RLQ, Has moderate right renal angle punch tenderness and no palpable organomegaly, no palpable masses, normal bowel sounds. GENITALIA:  Not examined. LOWER EXTREMITIES:  No pitting edema, palpable peripheral pulses. MUSCULOSKELETAL SYSTEM:  Unremarkable. CENTRAL NERVOUS SYSTEM:  No focal neurologic deficit on gross examination.  Lab Results:  [facility] 03/28/12 1856  WBC 10.0  HGB 14.1  HCT 40.1  PLT 219    Basename 03/28/12 1856  NA 139  K 3.4*  CL 104  CO2 25  GLUCOSE 123*  BUN 5*  CREATININE 0.84  CALCIUM 9.6   No results found for this or any previous visit (from the past 240 hour(s)).   Studies/Results: Dg Abd 1 View  03/28/2012  *RADIOLOGY REPORT*  Clinical Data: Right lower quadrant abdominal pain for 2 days.  ABDOMEN -  1 VIEW  Comparison: None.  Findings: Bowel gas pattern is normal.  No abnormal calcifications or bony findings.  Naval piercing incidentally noted.  IMPRESSION: Unremarkable one-view radiograph.   Original Report  Authenticated By: Paulina Fusi, M.D.    Ct Abdomen Pelvis W Contrast  03/29/2012  *RADIOLOGY REPORT*  Clinical Data: Right lower quadrant abdominal pain  CT ABDOMEN AND PELVIS WITH CONTRAST  Technique:  Multidetector CT imaging of the abdomen and pelvis was performed following the standard protocol during bolus administration of intravenous contrast.  Contrast: OMNIPAQUE IOHEXOL 300 MG/ML  SOLN  Comparison: 03/28/2012 radiograph  Findings: Linear opacity within the left lower lobe, favor atelectasis.  Normal heart size.  Unremarkable liver, biliary system, spleen, pancreas, adrenal glands, left kidney.  No hydronephrosis or hydroureter.  Arising within the lower pole right kidney, there is a 1.6 cm peripherally enhancing lesion as seen on coronal image 36.  There is associated perinephric fat stranding / ill-defined fluid.  No CT evidence for colitis.  The appendix is distended up to 11 mm. An there is air and fecal debris within the appendix, including air within the tip.  There is mild stranding adjacent to the tip of the appendix however this is favored to track inferiorly from the renal process described above.  No bowel obstruction.  No pathologically enlarged lymph nodes. No free intraperitoneal air.  No acute osseous finding.  Normal caliber aorta and branch vessels.  Circumferential bladder wall thickening.  Nonspecific appearance to the uterus.  No adnexal mass.  IMPRESSION: Ill-defined peripherally enhancing lesion within the lower pole right kidney, with adjacent perinephric fat stranding and fluid. This is favored to reflect pyelonephritis with developing renal abscess.  Correlate with urinalysis.  The appendix is distended up to 11 mm.  Minimal periappendiceal fat stranding / fluid is favored to track inferiorly from the renal process described above. While disfavored, it is not possible to exclude appendicitis in this setting.   Original Report Authenticated By: Jearld Lesch, M.D.      Medications: Scheduled Meds:   . ertapenem  1 g Intravenous QHS   Continuous Infusions:   . 0.9 % NaCl with KCl 20 mEq / L 125 mL/hr at 03/29/12 0745   PRN Meds:.HYDROmorphone (DILAUDID) injection, ondansetron (ZOFRAN) IV, ondansetron    LOS: 1 day   Ariauna Farabee,CHRISTOPHER  [facility] Pager [phone]. If 8PM-8AM, please contact night-coverage at [email or web address], password [facility] 03/29/2012, 8:11 AM  LOS: 1 day

## 2012-03-30 LAB — PROTIME-INR
INR: 1.06 (ref 0.00–1.49)
Prothrombin Time: 13.7 s (ref 11.6–15.2)

## 2012-03-30 LAB — CBC
HCT: 34.5 % — ABNORMAL LOW (ref 36.0–46.0)
Hemoglobin: 12 g/dL (ref 12.0–15.0)
MCH: 29.6 pg (ref 26.0–34.0)
MCHC: 34.8 g/dL (ref 30.0–36.0)
MCV: 85 fL (ref 78.0–100.0)
Platelets: 210 K/uL (ref 150–400)
RBC: 4.06 MIL/uL (ref 3.87–5.11)
RDW: 12.7 % (ref 11.5–15.5)
WBC: 6.1 K/uL (ref 4.0–10.5)

## 2012-03-30 LAB — BASIC METABOLIC PANEL WITH GFR
BUN: 3 mg/dL — ABNORMAL LOW (ref 6–23)
CO2: 25 meq/L (ref 19–32)
Calcium: 8.7 mg/dL (ref 8.4–10.5)
Chloride: 106 meq/L (ref 96–112)
Creatinine, Ser: 0.71 mg/dL (ref 0.50–1.10)
GFR calc Af Amer: >90 mL/min
GFR calc non Af Amer: >90 mL/min
Glucose, Bld: 93 mg/dL (ref 70–99)
Potassium: 4.1 meq/L (ref 3.5–5.1)
Sodium: 137 meq/L (ref 135–145)

## 2012-03-30 LAB — URINE CULTURE
Colony Count: >=100000
Colony Count: >=100000
Special Requests: NORMAL

## 2012-03-30 MED ORDER — ENOXAPARIN SODIUM 40 MG/0.4ML ~~LOC~~ SOLN
40.0000 mg | SUBCUTANEOUS | Status: DC
  Administered 2012-03-30 – 2012-03-31 (×2): 40 mg via SUBCUTANEOUS
  Filled 2012-03-30 (×3): qty 0.4

## 2012-03-30 MED ORDER — KCL IN DEXTROSE-NACL 20-5-0.9 MEQ/L-%-% IV SOLN
INTRAVENOUS | Status: DC
  Administered 2012-03-30 (×2): via INTRAVENOUS
  Filled 2012-03-30 (×4): qty 1000

## 2012-03-30 NOTE — Progress Notes (Signed)
[facility] PROGRESS NOTE  Kristen [name] UEA:[id number] DOB: March 30, 1990 DOA: 03/28/2012 PCP: Default, Provider, MD  Assessment/Plan: Principal Problem:  *Abdominal pain, acute, right lower quadrant Active Problems:  UTI (lower urinary tract infection)    1. Acute Pyelonephritis: Patient presented with 2 days of right flank/lower abdominal pain, without fever or chills. Urinalysis demonstrated a positive sediment, with pyuria and bacteriuria, consistent with a UTI. Abdominal/pelvic CT scan showed an Ill-defined peripherally enhancing lesion within the lower pole right kidney, with adjacent perinephric fat stranding and fluid, favored to reflect pyelonephritis with developing renal abscess. The appendix is distended up to 11 mm. Minimal periappendiceal fat stranding / fluid is favored to track inferiorly from the renal process described above. Managing with with Invanz day#2 (Had one dose of iv Rocephin in ED on 03/28/12). Patient has had no pyrexia since hospitalization, continues to improve clinically, and tolerated clears on 03/29/12. Surgical team has started advanced diet today. Urine culture grew E. Coli.  2. Abdominal pain:  This is due to #1 above. Given CT scan findings, suggestive of possible appendiceal pathology, Dr Jimmye Norman was called on surgical consultation, and has opined that patient is unlikely to have acute appendicitis. He has recommended conservative management with iv  Antibiotics, for now. Dr Manus Rudd has sen patient on 03/30/12. He feels patient has peri-appendicitis due to her ipsilateral pyelonephritis, and has recommended conservative management.   Code Status: Full Code.  Family Communication:  Disposition Plan: To be determined.    Brief narrative: 22 yo female healthy with 2 days of worsening rlq sharp abd pain. No n/v. No flank pain. No dysuria. Still has appendix. No diarrhea. No fevers.   Consultants:  Dr Jimmye Norman, surgeon.   Procedures:  CT  abdomen/pelvis.   Abdominal X-Ray.   Antibiotics:  Rocephin 03/28/12.  Invanz 03/29/12>>>  HPI/Subjective: No new issues. Better.   Objective: Vital signs in last 24 hours: Temp:  [98.4 F (36.9 C)-98.5 F (36.9 C)] 98.4 F (36.9 C) (01/31 0516) Pulse Rate:  [59-64] 62  (01/31 0516) Resp:  [16-18] 18  (01/31 0516) BP: (90-105)/(61-66) 90/64 mmHg (01/31 0516) SpO2:  [100 %] 100 % (01/31 0516) Weight change:  Last BM Date: 03/29/12  Intake/Output from previous day: 01/30 0701 - 01/31 0700 In: 1381.3 [P.O.:600; I.V.:781.3] Out: -      Physical Exam: General: Comfortable, alert, communicative, fully oriented, not short of breath at rest.  HEENT:  No clinical pallor, no jaundice, no conjunctival injection or discharge. Hydration is fair.  NECK:  Supple, JVP not seen, no carotid bruits, no palpable lymphadenopathy, no palpable goiter. CHEST:  Clinically clear to auscultation, no wheezes, no crackles. HEART:  Sounds 1 and 2 heard, normal, regular, no murmurs. ABDOMEN:  Flat, soft, mildly tender in RLQ, Has moderate right renal angle punch tenderness and no palpable organomegaly, no palpable masses, normal bowel sounds. GENITALIA:  Not examined. LOWER EXTREMITIES:  No pitting edema, palpable peripheral pulses. MUSCULOSKELETAL SYSTEM:  Unremarkable. CENTRAL NERVOUS SYSTEM:  No focal neurologic deficit on gross examination.  Lab Results:  Basename 03/30/12 0520 03/28/12 1856  WBC 6.1 10.0  HGB 12.0 14.1  HCT 34.5* 40.1  PLT 210 219    Basename 03/30/12 0520 03/28/12 1856  NA 137 139  K 4.1 3.4*  CL 106 104  CO2 25 25  GLUCOSE 93 123*  BUN 3* 5*  CREATININE 0.71 0.84  CALCIUM 8.7 9.6   Recent Results (from the past 240 hour(s))  URINE CULTURE  Status: Normal   Collection Time   03/28/12  5:10 PM      Component Value Range Status Comment   Specimen Description URINE, CLEAN CATCH   Final    Special Requests Normal   Final    Culture  Setup Time 03/28/2012  20:52   Final    Colony Count >=100,000 COLONIES/ML   Final    Culture ESCHERICHIA COLI   Final    Report Status 03/30/2012 FINAL   Final    Organism ID, Bacteria ESCHERICHIA COLI   Final   URINE CULTURE     Status: Normal   Collection Time   03/28/12  7:02 PM      Component Value Range Status Comment   Specimen Description URINE, CLEAN CATCH   Final    Special Requests NONE   Final    Culture  Setup Time 03/29/2012 03:09   Final    Colony Count >=100,000 COLONIES/ML   Final    Culture ESCHERICHIA COLI   Final    Report Status 03/30/2012 FINAL   Final    Organism ID, Bacteria ESCHERICHIA COLI   Final      Studies/Results: Dg Abd 1 View  03/28/2012  *RADIOLOGY REPORT*  Clinical Data: Right lower quadrant abdominal pain for 2 days.  ABDOMEN - 1 VIEW  Comparison: None.  Findings: Bowel gas pattern is normal.  No abnormal calcifications or bony findings.  Naval piercing incidentally noted.  IMPRESSION: Unremarkable one-view radiograph.   Original Report Authenticated By: Paulina Fusi, M.D.    Ct Abdomen Pelvis W Contrast  03/29/2012  *RADIOLOGY REPORT*  Clinical Data: Right lower quadrant abdominal pain  CT ABDOMEN AND PELVIS WITH CONTRAST  Technique:  Multidetector CT imaging of the abdomen and pelvis was performed following the standard protocol during bolus administration of intravenous contrast.  Contrast: OMNIPAQUE IOHEXOL 300 MG/ML  SOLN  Comparison: 03/28/2012 radiograph  Findings: Linear opacity within the left lower lobe, favor atelectasis.  Normal heart size.  Unremarkable liver, biliary system, spleen, pancreas, adrenal glands, left kidney.  No hydronephrosis or hydroureter.  Arising within the lower pole right kidney, there is a 1.6 cm peripherally enhancing lesion as seen on coronal image 36.  There is associated perinephric fat stranding / ill-defined fluid.  No CT evidence for colitis.  The appendix is distended up to 11 mm. An there is air and fecal debris within the appendix,  including air within the tip.  There is mild stranding adjacent to the tip of the appendix however this is favored to track inferiorly from the renal process described above.  No bowel obstruction.  No pathologically enlarged lymph nodes. No free intraperitoneal air.  No acute osseous finding.  Normal caliber aorta and branch vessels.  Circumferential bladder wall thickening.  Nonspecific appearance to the uterus.  No adnexal mass.  IMPRESSION: Ill-defined peripherally enhancing lesion within the lower pole right kidney, with adjacent perinephric fat stranding and fluid. This is favored to reflect pyelonephritis with developing renal abscess.  Correlate with urinalysis.  The appendix is distended up to 11 mm.  Minimal periappendiceal fat stranding / fluid is favored to track inferiorly from the renal process described above. While disfavored, it is not possible to exclude appendicitis in this setting.   Original Report Authenticated By: Jearld Lesch, M.D.     Medications: Scheduled Meds:    . enoxaparin (LOVENOX) injection  40 mg Subcutaneous Q24H  . ertapenem  1 g Intravenous QHS   Continuous Infusions:    .  dextrose 5 % and 0.9 % NaCl with KCl 20 mEq/L 100 mL/hr at 03/30/12 0948   PRN Meds:.HYDROmorphone (DILAUDID) injection, ondansetron (ZOFRAN) IV, ondansetron    LOS: 2 days   Kasidee Voisin,CHRISTOPHER  [facility] Pager [phone]. If 8PM-8AM, please contact night-coverage at [email or web address], password [facility] 03/30/2012, 12:59 PM  LOS: 2 days

## 2012-03-30 NOTE — Progress Notes (Signed)
Subjective: Feels better still sore in her RUQ, +BM and +flatus.  Objective: Vital signs in last 24 hours: Temp:  [98.4 F (36.9 C)-98.5 F (36.9 C)] 98.4 F (36.9 C) (01/31 0516) Pulse Rate:  [59-64] 62  (01/31 0516) Resp:  [16-18] 18  (01/31 0516) BP: (90-105)/(61-66) 90/64 mmHg (01/31 0516) SpO2:  [100 %] 100 % (01/31 0516) Last BM Date: 03/29/12  600 ml PO recorded, afebrile, VSS, Diet: clears, labs OK,  Intake/Output from previous day: 01/30 0701 - 01/31 0700 In: 1381.3 [P.O.:600; I.V.:781.3] Out: -  Intake/Output this shift:    General appearance: alert, cooperative and no distress GI: soft, tender in the RUQ, not complaining of tenderness in the RLQ or flank. +BS and +BM  Lab Results:   [facility] 03/30/12 0520 03/28/12 1856  WBC 6.1 10.0  HGB 12.0 14.1  HCT 34.5* 40.1  PLT 210 219    BMET  Basename 03/30/12 0520 03/28/12 1856  NA 137 139  K 4.1 3.4*  CL 106 104  CO2 25 25  GLUCOSE 93 123*  BUN 3* 5*  CREATININE 0.71 0.84  CALCIUM 8.7 9.6   PT/INR  Basename 03/30/12 0520  LABPROT 13.7  INR 1.06     Lab 03/28/12 1856  AST 16  ALT 10  ALKPHOS 76  BILITOT 0.5  PROT 8.0  ALBUMIN 3.8     Lipase  No results found for this basename: lipase     Studies/Results: Dg Abd 1 View  03/28/2012  *RADIOLOGY REPORT*  Clinical Data: Right lower quadrant abdominal pain for 2 days.  ABDOMEN - 1 VIEW  Comparison: None.  Findings: Bowel gas pattern is normal.  No abnormal calcifications or bony findings.  Naval piercing incidentally noted.  IMPRESSION: Unremarkable one-view radiograph.   Original Report Authenticated By: Paulina Fusi, M.D.    Ct Abdomen Pelvis W Contrast  03/29/2012  *RADIOLOGY REPORT*  Clinical Data: Right lower quadrant abdominal pain  CT ABDOMEN AND PELVIS WITH CONTRAST  Technique:  Multidetector CT imaging of the abdomen and pelvis was performed following the standard protocol during bolus administration of intravenous contrast.   Contrast: OMNIPAQUE IOHEXOL 300 MG/ML  SOLN  Comparison: 03/28/2012 radiograph  Findings: Linear opacity within the left lower lobe, favor atelectasis.  Normal heart size.  Unremarkable liver, biliary system, spleen, pancreas, adrenal glands, left kidney.  No hydronephrosis or hydroureter.  Arising within the lower pole right kidney, there is a 1.6 cm peripherally enhancing lesion as seen on coronal image 36.  There is associated perinephric fat stranding / ill-defined fluid.  No CT evidence for colitis.  The appendix is distended up to 11 mm. An there is air and fecal debris within the appendix, including air within the tip.  There is mild stranding adjacent to the tip of the appendix however this is favored to track inferiorly from the renal process described above.  No bowel obstruction.  No pathologically enlarged lymph nodes. No free intraperitoneal air.  No acute osseous finding.  Normal caliber aorta and branch vessels.  Circumferential bladder wall thickening.  Nonspecific appearance to the uterus.  No adnexal mass.  IMPRESSION: Ill-defined peripherally enhancing lesion within the lower pole right kidney, with adjacent perinephric fat stranding and fluid. This is favored to reflect pyelonephritis with developing renal abscess.  Correlate with urinalysis.  The appendix is distended up to 11 mm.  Minimal periappendiceal fat stranding / fluid is favored to track inferiorly from the renal process described above. While disfavored, it is not  possible to exclude appendicitis in this setting.   Original Report Authenticated By: Jearld Lesch, M.D.     Medications:    . ertapenem  1 g Intravenous QHS     Assessment/Plan Acute Pyelonephritis  Possible appendicitis   Plan:  Continue antibiotics, and follow.  I will discuss advancing diet with Dr. [name]. Add some IV fluids while she is getting back on diet and DVT- lovenox.    LOS: 2 days    Molina Hollenback 03/30/2012

## 2012-03-30 NOTE — Progress Notes (Signed)
Less tender Very hungry Afebrile, nl WBC  Periappendicitis secondary to adjacent Right perinephric abscess  Continue antibiotics Advance diet Will need to go home on PO antibiotics (Cipro/ Flagyl).  Would give at least 24 more hours of IV antibiotics.  Wilmon Arms. [name], MD, [facility]  03/30/2012 8:22 AM

## 2012-03-31 LAB — BASIC METABOLIC PANEL
BUN: 6 mg/dL (ref 6–23)
CO2: 26 mEq/L (ref 19–32)
Calcium: 8.9 mg/dL (ref 8.4–10.5)
Chloride: 105 mEq/L (ref 96–112)
Creatinine, Ser: 0.73 mg/dL (ref 0.50–1.10)
GFR calc Af Amer: >90 mL/min (ref >90)
GFR calc non Af Amer: >90 mL/min (ref >90)
Glucose, Bld: 106 mg/dL — ABNORMAL HIGH (ref 70–99)
Potassium: 4.5 mEq/L (ref 3.5–5.1)
Sodium: 137 mEq/L (ref 135–145)

## 2012-03-31 LAB — CBC
HCT: 36.3 % (ref 36.0–46.0)
Hemoglobin: 12.4 g/dL (ref 12.0–15.0)
MCH: 29.2 pg (ref 26.0–34.0)
MCHC: 34.2 g/dL (ref 30.0–36.0)
MCV: 85.4 fL (ref 78.0–100.0)
Platelets: 237 10*3/uL (ref 150–400)
RBC: 4.25 MIL/uL (ref 3.87–5.11)
RDW: 12.8 % (ref 11.5–15.5)
WBC: 5.9 10*3/uL (ref 4.0–10.5)

## 2012-03-31 MED ORDER — METRONIDAZOLE 500 MG PO TABS: 500.0000 mg | ORAL_TABLET | Freq: Three times a day (TID) | ORAL | Status: DC

## 2012-03-31 MED ORDER — CIPROFLOXACIN HCL 500 MG PO TABS: 500.0000 mg | ORAL_TABLET | Freq: Two times a day (BID) | ORAL | Status: DC

## 2012-03-31 MED ORDER — CIPROFLOXACIN HCL 500 MG PO TABS
500.0000 mg | ORAL_TABLET | Freq: Two times a day (BID) | ORAL | Status: DC
  Filled 2012-03-31 (×4): qty 1

## 2012-03-31 MED ORDER — METRONIDAZOLE 500 MG PO TABS
500.0000 mg | ORAL_TABLET | Freq: Three times a day (TID) | ORAL | Status: DC
  Filled 2012-03-31 (×3): qty 1

## 2012-03-31 NOTE — Progress Notes (Signed)
  Subjective: Feels much better. Tolerating diet. Having loose stools. Denies pain. Wants to go home.  Afebrile. Vital signs stable. WBC 5900.  Objective: Vital signs in last 24 hours: Temp:  [97.6 F (36.4 C)-98.9 F (37.2 C)] 98.3 F (36.8 C) (02/01 0538) Pulse Rate:  [55-90] 55  (02/01 0538) Resp:  [16-18] 16  (02/01 0538) BP: (99-109)/(62-64) 100/62 mmHg (02/01 0538) SpO2:  [100 %] 100 % (02/01 0538) Last BM Date: 03/29/12  Intake/Output from previous day: 01/31 0701 - 02/01 0700 In: 1867.1 [P.O.:240; I.V.:1627.1] Out: -  Intake/Output this shift:    General appearance: a lower. No distress. Mental status normal. GI: abdomen is soft. Very minimal tenderness right lower quadrant. No guarding. No mass.  Lab Results:   Basename 03/31/12 0500 03/30/12 0520  WBC 5.9 6.1  HGB 12.4 12.0  HCT 36.3 34.5*  PLT 237 210   BMET  Basename 03/31/12 0500 03/30/12 0520  NA 137 137  K 4.5 4.1  CL 105 106  CO2 26 25  GLUCOSE 106* 93  BUN 6 3*  CREATININE 0.73 0.71  CALCIUM 8.9 8.7   PT/INR  Basename 03/30/12 0520  LABPROT 13.7  INR 1.06   ABG No results found for this basename: PHART:2,PCO2:2,PO2:2,HCO3:2 in the last 72 hours  Studies/Results: No results found.  Anti-infectives: Anti-infectives     Start     Dose/Rate Route Frequency Ordered Stop   03/29/12 2200   ertapenem (INVANZ) 1 g in sodium chloride 0.9 % 50 mL IVPB        1 g 100 mL/hr over 30 Minutes Intravenous Daily at bedtime 03/29/12 0625     03/29/12 0315   ertapenem (INVANZ) 1 g in sodium chloride 0.9 % 50 mL IVPB        1 g 100 mL/hr over 30 Minutes Intravenous  Once 03/29/12 0301 03/29/12 0419   03/28/12 2230   cefTRIAXone (ROCEPHIN) 1 g in dextrose 5 % 50 mL IVPB        1 g 100 mL/hr over 30 Minutes Intravenous  Once 03/28/12 2224 03/29/12 0011          Assessment/Plan:  Acute pyelonephritis, lower pole right kidney Possible secondary appendiceal inflammation. I've reviewed CT scan  and although appendix is somewhat dilated, there is no impressive primary inflammation.  Recommendation: Discharge home on Cipro and Flagyl for 10 days. No indication for surgical followup unless symptoms recur   LOS: 3 days    Kristen [name] M 03/31/2012

## 2012-03-31 NOTE — Discharge Summary (Signed)
Physician Discharge Summary  Kristen [name] YNW:[id number] DOB: [birth year] DOA: 03/28/2012  PCP: Default, Provider, MD  Admit date: 03/28/2012 Discharge date: 03/31/2012  Time spent: 40 minutes  Recommendations for Outpatient Follow-up:  1. Follow up with general surgery as needed.   Discharge Diagnoses:  Principal Problem:  *Abdominal pain, acute, right lower quadrant Active Problems:  UTI (lower urinary tract infection)   Discharge Condition: Satisfactory.   Diet recommendation: Regular.   Filed Weights   03/29/12 0700  Weight: 66.225 kg (146 lb)    History of present illness:  22 yo healthy [name], presenting with 2 days of worsening RLQ sharp abdominal pain. No n/v. No flank pain. No dysuria. Still has appendix. No diarrhea. No fevers. Admitted for further evaluation and management.   Hospital Course:  1. Acute Pyelonephritis: Patient presented with 2 days of right flank/lower abdominal pain, without fever or chills. Urinalysis demonstrated a positive sediment, with pyuria and bacteriuria, consistent with a UTI. Abdominal/pelvic CT scan showed an Ill-defined peripherally enhancing lesion within the lower pole right kidney, with adjacent perinephric fat stranding and fluid, favored to reflect pyelonephritis with developing renal abscess. The appendix was distended up to 11 mm. Minimal periappendiceal fat stranding/fluid is favored to track inferiorly from the renal process described above. Patient was managed with iv Invanz day#2 (had one dose of iv Rocephin in ED on 03/28/12). Thereafter, she had no recorded pyrexia during her hospitalization, improved steadily clinically, tolerated clears on 03/29/12 and was able to resume regular diet on 03/30/12. Urine culture grew pan-sensitive E. Coli. Patient has been transitioned to oral Ciprofloxacin effective 03/31/12, for a further 10 days, to be concluded on 04/09/12.  2. Abdominal pain/Peri-appendicitis: This was due to #1 above. Given CT scan  findings, suggestive of possible appendiceal pathology, Dr Jimmye Norman was called on surgical consultation, and opined that patient was unlikely to have acute appendicitis. He recommended conservative management with iv antibiotics, and continued observation. Dr Manus Rudd saw patient on 03/30/12, concluded that patient has peri-appendicitis due to her ipsilateral pyelonephritis, and confirmed continued conservative management. Patient was cleared for discharge on 10 days of oral Ciprofloxacin/Flagyl, with prn general surgical follow up, by Dr Claud Kelp, on 03/31/12. Wcc was normal throughout hospitalization, and as of 03/30/12, abdominal pain had resolved.   Procedures:  See Below.   Consultations:  Dr Jimmye Norman, surgeon.   Discharge Exam: Filed Vitals:   03/30/12 0516 03/30/12 1415 03/30/12 2118 03/31/12 0538  BP: 90/64 99/63 109/64 100/62  Pulse: 62 90 88 55  Temp: 98.4 F (36.9 C) 97.6 F (36.4 C) 98.9 F (37.2 C) 98.3 F (36.8 C)  TempSrc:  Oral    Resp: 18 18 16 16   Height:      Weight:      SpO2: 100% 100% 100% 100%    General: Comfortable, alert, communicative, fully oriented, not short of breath at rest.  HEENT: No clinical pallor, no jaundice, no conjunctival injection or discharge. Hydration is fair.  NECK: Supple, JVP not seen, no carotid bruits, no palpable lymphadenopathy, no palpable goiter.  CHEST: Clinically clear to auscultation, no wheezes, no crackles.  HEART: Sounds 1 and 2 heard, normal, regular, no murmurs.  ABDOMEN: Flat, soft, non-tender, has mild right renal angle punch tenderness and no palpable organomegaly, no palpable masses, normal bowel sounds.  GENITALIA: Not examined.  LOWER EXTREMITIES: No pitting edema, palpable peripheral pulses.  MUSCULOSKELETAL SYSTEM: Unremarkable.  CENTRAL NERVOUS SYSTEM: No focal neurologic deficit on gross examination.  Discharge Instructions  Discharge Orders    Future Orders Please Complete By Expires    Diet general      Increase activity slowly          Medication List     As of 03/31/2012 11:21 AM    TAKE these medications         acetaminophen 325 MG tablet   Commonly known as: TYLENOL   Take 650 mg by mouth every 6 (six) hours as needed. For pain      ciprofloxacin 500 MG tablet   Commonly known as: CIPRO   Take 1 tablet (500 mg total) by mouth 2 (two) times daily.      DEPO-PROVERA IM   Inject into the muscle.      metroNIDAZOLE 500 MG tablet   Commonly known as: FLAGYL   Take 1 tablet (500 mg total) by mouth every 8 (eight) hours.        Follow-up Information    Call Ernestene Mention, MD. (next week)    Contact information:   [address] [place] [address] [phone]           The results of significant diagnostics from this hospitalization (including imaging, microbiology, ancillary and laboratory) are listed below for reference.    Significant Diagnostic Studies: Dg Abd 1 View  03/28/2012  *RADIOLOGY REPORT*  Clinical Data: Right lower quadrant abdominal pain for 2 days.  ABDOMEN - 1 VIEW  Comparison: None.  Findings: Bowel gas pattern is normal.  No abnormal calcifications or bony findings.  Naval piercing incidentally noted.  IMPRESSION: Unremarkable one-view radiograph.   Original Report Authenticated By: Paulina Fusi, M.D.    Ct Abdomen Pelvis W Contrast  03/29/2012  *RADIOLOGY REPORT*  Clinical Data: Right lower quadrant abdominal pain  CT ABDOMEN AND PELVIS WITH CONTRAST  Technique:  Multidetector CT imaging of the abdomen and pelvis was performed following the standard protocol during bolus administration of intravenous contrast.  Contrast: OMNIPAQUE IOHEXOL 300 MG/ML  SOLN  Comparison: 03/28/2012 radiograph  Findings: Linear opacity within the left lower lobe, favor atelectasis.  Normal heart size.  Unremarkable liver, biliary system, spleen, pancreas, adrenal glands, left kidney.  No hydronephrosis or hydroureter.  Arising  within the lower pole right kidney, there is a 1.6 cm peripherally enhancing lesion as seen on coronal image 36.  There is associated perinephric fat stranding / ill-defined fluid.  No CT evidence for colitis.  The appendix is distended up to 11 mm. An there is air and fecal debris within the appendix, including air within the tip.  There is mild stranding adjacent to the tip of the appendix however this is favored to track inferiorly from the renal process described above.  No bowel obstruction.  No pathologically enlarged lymph nodes. No free intraperitoneal air.  No acute osseous finding.  Normal caliber aorta and branch vessels.  Circumferential bladder wall thickening.  Nonspecific appearance to the uterus.  No adnexal mass.  IMPRESSION: Ill-defined peripherally enhancing lesion within the lower pole right kidney, with adjacent perinephric fat stranding and fluid. This is favored to reflect pyelonephritis with developing renal abscess.  Correlate with urinalysis.  The appendix is distended up to 11 mm.  Minimal periappendiceal fat stranding / fluid is favored to track inferiorly from the renal process described above. While disfavored, it is not possible to exclude appendicitis in this setting.   Original Report Authenticated By: Jearld Lesch, M.D.     Microbiology: Recent Results (from the  past 240 hour(s))  URINE CULTURE     Status: Normal   Collection Time   03/28/12  5:10 PM      Component Value Range Status Comment   Specimen Description URINE, CLEAN CATCH   Final    Special Requests Normal   Final    Culture  Setup Time 03/28/2012 20:52   Final    Colony Count >=100,000 COLONIES/ML   Final    Culture ESCHERICHIA COLI   Final    Report Status 03/30/2012 FINAL   Final    Organism ID, Bacteria ESCHERICHIA COLI   Final   URINE CULTURE     Status: Normal   Collection Time   03/28/12  7:02 PM      Component Value Range Status Comment   Specimen Description URINE, CLEAN CATCH   Final     Special Requests NONE   Final    Culture  Setup Time 03/29/2012 03:09   Final    Colony Count >=100,000 COLONIES/ML   Final    Culture ESCHERICHIA COLI   Final    Report Status 03/30/2012 FINAL   Final    Organism ID, Bacteria ESCHERICHIA COLI   Final      Labs: Basic Metabolic Panel:  Lab 03/31/12 6045 03/30/12 0520 03/28/12 1856  NA 137 137 139  K 4.5 4.1 3.4*  CL 105 106 104  CO2 26 25 25   GLUCOSE 106* 93 123*  BUN 6 3* 5*  CREATININE 0.73 0.71 0.84  CALCIUM 8.9 8.7 9.6  MG -- -- --  PHOS -- -- --   Liver Function Tests:  Lab 03/28/12 1856  AST 16  ALT 10  ALKPHOS 76  BILITOT 0.5  PROT 8.0  ALBUMIN 3.8   No results found for this basename: LIPASE:5,AMYLASE:5 in the last 168 hours No results found for this basename: AMMONIA:5 in the last 168 hours CBC:  Lab 03/31/12 0500 03/30/12 0520 03/28/12 1856  WBC 5.9 6.1 10.0  NEUTROABS -- -- 7.8*  HGB 12.4 12.0 14.1  HCT 36.3 34.5* 40.1  MCV 85.4 85.0 85.3  PLT 237 210 219   Cardiac Enzymes: No results found for this basename: CKTOTAL:5,CKMB:5,CKMBINDEX:5,TROPONINI:5 in the last 168 hours BNP: BNP (last 3 results) No results found for this basename: PROBNP:3 in the last 8760 hours CBG: No results found for this basename: GLUCAP:5 in the last 168 hours     Signed:  Deboraha Goar,CHRISTOPHER  [facility] 03/31/2012, 11:21 AM

## 2012-04-01 NOTE — ED Notes (Addendum)
GC/Chlamydia neg., Affirm: all neg., Urine culture: >100,000 colonies E. Coli.  Pt. was transferred to ED and given Rocephin 1 gm IV.  Pt. got Cipro on 2/1.  Both sensitive on culture report. Vassie Moselle 04/01/2012

## 2012-05-23 ENCOUNTER — Ambulatory Visit (INDEPENDENT_AMBULATORY_CARE_PROVIDER_SITE_OTHER): Payer: Medicaid Other | Admitting: *Deleted

## 2012-05-23 VITALS — BP 113/78 | HR 100 | Temp 98.5°F | Ht 64.0 in | Wt 134.0 lb

## 2012-05-23 DIAGNOSIS — Z3049 Encounter for surveillance of other contraceptives: Secondary | ICD-10-CM

## 2012-05-23 DIAGNOSIS — Z309 Encounter for contraceptive management, unspecified: Secondary | ICD-10-CM

## 2012-05-23 MED ORDER — MEDROXYPROGESTERONE ACETATE 150 MG/ML IM SUSP
150.0000 mg | Freq: Once | INTRAMUSCULAR | Status: AC
  Administered 2012-05-23: 150 mg via INTRAMUSCULAR

## 2012-05-23 NOTE — Progress Notes (Signed)
Pt is to return in 12 weeks for next depo-provera. Pt denies any issues at this time. Pt tolerated injection well.

## 2012-05-23 NOTE — Patient Instructions (Addendum)
Continue medications as directed. Please follow up as directed.

## 2012-08-23 ENCOUNTER — Ambulatory Visit: Payer: Medicaid Other

## 2012-08-23 ENCOUNTER — Ambulatory Visit (INDEPENDENT_AMBULATORY_CARE_PROVIDER_SITE_OTHER): Payer: Medicaid Other | Admitting: *Deleted

## 2012-08-23 VITALS — BP 115/83 | HR 89 | Temp 98.6°F | Ht 64.0 in | Wt 139.0 lb

## 2012-08-23 DIAGNOSIS — Z3202 Encounter for pregnancy test, result negative: Secondary | ICD-10-CM

## 2012-08-23 DIAGNOSIS — Z3049 Encounter for surveillance of other contraceptives: Secondary | ICD-10-CM

## 2012-08-23 LAB — POCT URINE PREGNANCY: Preg Test, Ur: NEGATIVE

## 2012-08-23 MED ORDER — MEDROXYPROGESTERONE ACETATE 150 MG/ML IM SUSP
150.0000 mg | INTRAMUSCULAR | Status: AC
  Administered 2012-08-23 – 2013-06-04 (×4): 150 mg via INTRAMUSCULAR

## 2012-08-23 NOTE — Progress Notes (Signed)
Patient is here today for her Depo injection.  Patient due date for this injection was 08/14/12 and is three days late.  Per Dr. Tamela Oddi with a negative UPT, patient may still have the injection today.  Patient to return for next injection on 11/14/12.

## 2012-11-14 ENCOUNTER — Ambulatory Visit: Payer: Medicaid Other

## 2012-11-19 ENCOUNTER — Other Ambulatory Visit: Payer: Medicaid Other

## 2012-11-20 ENCOUNTER — Other Ambulatory Visit (INDEPENDENT_AMBULATORY_CARE_PROVIDER_SITE_OTHER): Payer: Medicaid Other

## 2012-11-20 VITALS — BP 114/76 | HR 103 | Temp 98.0°F | Wt 140.0 lb

## 2012-11-20 DIAGNOSIS — Z3202 Encounter for pregnancy test, result negative: Secondary | ICD-10-CM

## 2012-11-20 DIAGNOSIS — Z3049 Encounter for surveillance of other contraceptives: Secondary | ICD-10-CM

## 2012-11-20 LAB — POCT URINE PREGNANCY: Preg Test, Ur: NEGATIVE

## 2012-11-20 NOTE — Progress Notes (Unsigned)
Pt in office for a pregnancy test. Pt due for Depo injection on 11-14-12. Pt states she does not have Depo in office with her today. Pt is unable to return to office later today. Pt states she will return in two weeks for a second pregnancy test and her Depo injection. Pt advised to abstain from intercourse for 2 weeks.

## 2012-12-04 ENCOUNTER — Ambulatory Visit: Payer: Medicaid Other

## 2012-12-05 ENCOUNTER — Other Ambulatory Visit: Payer: Self-pay | Admitting: *Deleted

## 2012-12-05 ENCOUNTER — Ambulatory Visit: Payer: Medicaid Other

## 2012-12-05 DIAGNOSIS — IMO0001 Reserved for inherently not codable concepts without codable children: Secondary | ICD-10-CM

## 2012-12-05 MED ORDER — MEDROXYPROGESTERONE ACETATE 150 MG/ML IM SUSP: 150.0000 mg | INTRAMUSCULAR | Status: DC

## 2012-12-13 ENCOUNTER — Ambulatory Visit (INDEPENDENT_AMBULATORY_CARE_PROVIDER_SITE_OTHER): Payer: Medicaid Other | Admitting: *Deleted

## 2012-12-13 VITALS — BP 104/66 | HR 80 | Temp 97.7°F | Ht 64.0 in | Wt 137.8 lb

## 2012-12-13 DIAGNOSIS — IMO0001 Reserved for inherently not codable concepts without codable children: Secondary | ICD-10-CM

## 2012-12-13 DIAGNOSIS — Z309 Encounter for contraceptive management, unspecified: Secondary | ICD-10-CM

## 2012-12-13 DIAGNOSIS — Z3202 Encounter for pregnancy test, result negative: Secondary | ICD-10-CM

## 2012-12-13 LAB — POCT URINE PREGNANCY: Preg Test, Ur: NEGATIVE

## 2012-12-13 NOTE — Progress Notes (Signed)
Patient in office today for a UPT and her Depo injection.  UPT negative on 11/20/12 and today.  Patient tolerated well - RTO on 03/06/2013.

## 2013-02-25 ENCOUNTER — Encounter (HOSPITAL_COMMUNITY): Payer: Self-pay | Admitting: Emergency Medicine

## 2013-02-25 DIAGNOSIS — R51 Headache: Secondary | ICD-10-CM | POA: Insufficient documentation

## 2013-02-25 DIAGNOSIS — H53149 Visual discomfort, unspecified: Secondary | ICD-10-CM | POA: Insufficient documentation

## 2013-02-25 NOTE — ED Notes (Signed)
Per EMS pt c/o headache x 1 wk; denies any other symptoms; ambulatory on arrival

## 2013-02-26 ENCOUNTER — Emergency Department (HOSPITAL_COMMUNITY)
Admission: EM | Admit: 2013-02-26 | Discharge: 2013-02-26 | Disposition: A | Discharge status: Home / Self Care | Payer: Medicaid Other | Attending: Emergency Medicine | Admitting: Emergency Medicine

## 2013-02-26 MED ORDER — TETRACAINE HCL 0.5 % OP SOLN
1.0000 [drp] | Freq: Once | OPHTHALMIC | Status: AC
  Administered 2013-02-26: 1 [drp] via OPHTHALMIC

## 2013-02-26 MED ORDER — KETOROLAC TROMETHAMINE 60 MG/2ML IM SOLN
60.0000 mg | Freq: Once | INTRAMUSCULAR | Status: AC
  Administered 2013-02-26: 60 mg via INTRAMUSCULAR
  Filled 2013-02-26: qty 2

## 2013-02-26 MED ORDER — METOCLOPRAMIDE HCL 5 MG/ML IJ SOLN
10.0000 mg | Freq: Once | INTRAMUSCULAR | Status: AC
  Administered 2013-02-26: 10 mg via INTRAMUSCULAR
  Filled 2013-02-26: qty 2

## 2013-02-26 MED ORDER — DIPHENHYDRAMINE HCL 50 MG/ML IJ SOLN
25.0000 mg | Freq: Once | INTRAMUSCULAR | Status: AC
  Administered 2013-02-26: 25 mg via INTRAMUSCULAR
  Filled 2013-02-26: qty 1

## 2013-02-26 NOTE — ED Provider Notes (Signed)
Patient signed out to me by Jobe Gibbon, PA-C.  Patient is a 22 year old female who presents today for a headache x1 week. Patient states that headache has been intermittent and without modifying factors. Patient has taken ibuprofen and Tylenol for her symptoms without relief. She states that headache is associated with a pain behind her left eye as well as bilateral photophobia. Patient denies associated fever, head injury or trauma, tinnitus, phonophobia, difficulty speaking or swallowing, vision loss, neck pain or stiffness, numbness/tingling, extremity weakness, or nausea/vomiting.  Patient with uncomplicated headache.    Plan: migraine cocktail.    7:21 AM  PE: Gen: A&O x4 HEENT: PERRL, EOM intact, no meningismus,  CHEST: RRR, no m/r/g LUNGS: CTAB, no w/r/r ABD: BS x 4, ND/NT EXT: No edema, strong peripheral pulses NEURO: Sensation and strength intact bilaterally, normal sensation, CN3-12 intact  Patient feels a little better after migraine cocktail.  Discharge to home with neurology follow-up.  Return precautions are given.  Patient is stable and ready for discharge.  Roxy Horseman, PA-C 02/26/13 [phone]

## 2013-02-26 NOTE — ED Notes (Signed)
Pt co. Headache x 2 weeks. Taking OTC medications : Ibuprofen and Excedrin w/o relief. She denies blurred vision.  Has photophobia.

## 2013-02-26 NOTE — ED Notes (Signed)
MD at bedside. 

## 2013-02-26 NOTE — ED Provider Notes (Signed)
Medical screening examination/treatment/procedure(s) were performed by non-physician practitioner and as supervising physician I was immediately available for consultation/collaboration.    Sunnie Nielsen, MD 02/26/13 2300

## 2013-02-26 NOTE — ED Provider Notes (Signed)
Medical screening examination/treatment/procedure(s) were performed by non-physician practitioner and as supervising physician I was immediately available for consultation/collaboration.    Ebonee Stober, MD 02/26/13 2300 

## 2013-02-26 NOTE — ED Provider Notes (Signed)
CSN: [id number]     Arrival date & time 02/25/13  2334 History   First MD Initiated Contact with Patient 02/26/13 0459     Chief Complaint  Patient presents with  . Headache   (Consider location/radiation/quality/duration/timing/severity/associated sxs/prior Treatment) HPI Comments: Patient is a 22 year old female who presents today for a headache x1 week. Patient states that headache has been intermittent and without modifying factors. Patient has taken ibuprofen and Tylenol for her symptoms without relief. She states that headache is associated with a pain behind her left eye as well as bilateral photophobia. Patient denies associated fever, head injury or trauma, tinnitus, phonophobia, difficulty speaking or swallowing, vision loss, neck pain or stiffness, numbness/tingling, extremity weakness, or nausea/vomiting.  Patient is a 22 y.o. female presenting with headaches. The history is provided by the patient. No language interpreter was used.  Headache Associated symptoms: photophobia     History reviewed. No pertinent past medical history. History reviewed. No pertinent past surgical history. No family history on file. History  Substance Use Topics  . Smoking status: Never Smoker   . Smokeless tobacco: Not on file  . Alcohol Use: No   OB History   Grav Para Term Preterm Abortions TAB SAB Ect Mult Living                 Review of Systems  Eyes: Positive for photophobia.  Neurological: Positive for headaches.  All other systems reviewed and are negative.    Allergies  Review of patient's allergies indicates no known allergies.  Home Medications   Current Outpatient Rx  Name  Route  Sig  Dispense  Refill  . medroxyPROGESTERone (DEPO-PROVERA) 150 MG/ML injection   Intramuscular   Inject 1 mL (150 mg total) into the muscle every 3 (three) months.   1 mL   3    BP 120/77  Pulse 95  Temp(Src) 98.3 F (36.8 C) (Oral)  Resp 14  Ht 5\' 4"  (1.626 m)  Wt 135 lb (61.236  kg)  BMI 23.16 kg/m2  SpO2 100%  Physical Exam  Nursing note and vitals reviewed. Constitutional: She is oriented to person, place, and time. She appears well-developed and well-nourished. No distress.  HENT:  Head: Normocephalic and atraumatic.  Mouth/Throat: Oropharynx is clear and moist. No oropharyngeal exudate.  Eyes: Conjunctivae and EOM are normal. Pupils are equal, round, and reactive to light. No scleral icterus.  IOP - 12 OS and OD with 95% CI  Neck: Normal range of motion. Neck supple.  Cardiovascular: Normal rate, regular rhythm and intact distal pulses.   Distal radial pulses 2+ bilaterally  Pulmonary/Chest: Effort normal. No respiratory distress.  Musculoskeletal: Normal range of motion.  Lymphadenopathy:    She has no cervical adenopathy.  Neurological: She is alert and oriented to person, place, and time. She has normal reflexes. No cranial nerve deficit. She exhibits normal muscle tone. Coordination normal.  GCS 15. Patient speaks in full goal oriented sentences. No cranial nerve deficits appreciated; patient with symmetric eyebrow raise, no facial droop, and equal tongue protrusion. She moves her extremities without ataxia.  Skin: Skin is warm and dry. No rash noted. She is not diaphoretic. No erythema. No pallor.  Psychiatric: She has a normal mood and affect. Her behavior is normal.    ED Course  Procedures (including critical care time) Labs Review Labs Reviewed - No data to display Imaging Review No results found.  EKG Interpretation   None       MDM  1. Headache    22 year old female presents for headache that has been intermittent x 1 week. Patient well and nontoxic appearing, hemodynamically stable, and afebrile. No nuchal rigidity or meningismus appreciated on physical exam. Neurologic exam today is nonfocal. Patient denies any head injury or trauma. My suspicion for acute intracranial process is low given intermittent nature of symptoms and  reassuring neurologic exam today. Intraocular pressure WNL bilaterally. Will treat patient with Toradol, Reglan, and Benadryl.  Patient signed out to Roxy Horseman, PA-C at shift change for reevaluation of symptoms and dispo.    Antony Madura, [place] 02/26/13 [phone]

## 2013-03-06 ENCOUNTER — Ambulatory Visit (INDEPENDENT_AMBULATORY_CARE_PROVIDER_SITE_OTHER): Payer: Medicaid Other | Admitting: *Deleted

## 2013-03-06 VITALS — BP 114/75 | HR 91 | Temp 98.9°F | Ht 64.0 in | Wt 138.0 lb

## 2013-03-06 DIAGNOSIS — Z3049 Encounter for surveillance of other contraceptives: Secondary | ICD-10-CM

## 2013-03-06 DIAGNOSIS — IMO0001 Reserved for inherently not codable concepts without codable children: Secondary | ICD-10-CM

## 2013-03-06 DIAGNOSIS — Z309 Encounter for contraceptive management, unspecified: Secondary | ICD-10-CM

## 2013-03-06 NOTE — Progress Notes (Signed)
Pt in office today for depo injection

## 2013-06-04 ENCOUNTER — Ambulatory Visit (INDEPENDENT_AMBULATORY_CARE_PROVIDER_SITE_OTHER): Payer: Medicaid Other | Admitting: *Deleted

## 2013-06-04 VITALS — BP 123/79 | HR 95 | Temp 97.2°F | Ht 64.0 in | Wt 144.0 lb

## 2013-06-04 DIAGNOSIS — Z3049 Encounter for surveillance of other contraceptives: Secondary | ICD-10-CM

## 2013-06-04 NOTE — Progress Notes (Signed)
Patient in office today for a Depo Injection. Patient was due for her Injection May 28, 2013. Patient is 8 days late for her injection. Patient states she last had intercourse 2 months ago. Per Dr. Clearance CootsHarper as long as her in office pregnancy test is negative, ok to give injection.  Pregnancy test performed in office and results were negative.  Next Injection due 08-26-13

## 2013-08-26 ENCOUNTER — Ambulatory Visit (INDEPENDENT_AMBULATORY_CARE_PROVIDER_SITE_OTHER): Payer: Medicaid Other | Admitting: *Deleted

## 2013-08-26 VITALS — BP 131/79 | HR 80 | Temp 98.6°F | Wt 149.0 lb

## 2013-08-26 DIAGNOSIS — Z3042 Encounter for surveillance of injectable contraceptive: Secondary | ICD-10-CM

## 2013-08-26 DIAGNOSIS — Z3049 Encounter for surveillance of other contraceptives: Secondary | ICD-10-CM

## 2013-08-26 MED ORDER — MEDROXYPROGESTERONE ACETATE 150 MG/ML IM SUSP
150.0000 mg | Freq: Once | INTRAMUSCULAR | Status: AC
  Administered 2013-08-26: 150 mg via INTRAMUSCULAR

## 2013-08-26 NOTE — Progress Notes (Signed)
Pt is in office today for depo injection.  Pt is on time for her depo.  Depo given in right upper outer quadrant.  Pt tolerated well.  Pt advised to RTO on 11/17/13 for next injection.

## 2013-10-17 ENCOUNTER — Emergency Department (HOSPITAL_COMMUNITY): Payer: Medicaid Other

## 2013-10-17 ENCOUNTER — Encounter (HOSPITAL_COMMUNITY): Payer: Self-pay | Admitting: Emergency Medicine

## 2013-10-17 ENCOUNTER — Emergency Department (HOSPITAL_COMMUNITY)
Admission: EM | Admit: 2013-10-17 | Discharge: 2013-10-17 | Disposition: A | Discharge status: Home / Self Care | Payer: Medicaid Other | Attending: Emergency Medicine | Admitting: Emergency Medicine

## 2013-10-17 DIAGNOSIS — R109 Unspecified abdominal pain: Secondary | ICD-10-CM | POA: Insufficient documentation

## 2013-10-17 DIAGNOSIS — Z79899 Other long term (current) drug therapy: Secondary | ICD-10-CM | POA: Insufficient documentation

## 2013-10-17 DIAGNOSIS — N390 Urinary tract infection, site not specified: Secondary | ICD-10-CM | POA: Insufficient documentation

## 2013-10-17 DIAGNOSIS — Z3202 Encounter for pregnancy test, result negative: Secondary | ICD-10-CM | POA: Insufficient documentation

## 2013-10-17 LAB — COMPREHENSIVE METABOLIC PANEL
ALT: 9 U/L (ref 0–35)
AST: 20 U/L (ref 0–37)
Albumin: 4 g/dL (ref 3.5–5.2)
Alkaline Phosphatase: 65 U/L (ref 39–117)
Anion gap: 14 (ref 5–15)
BUN: 10 mg/dL (ref 6–23)
CO2: 22 mEq/L (ref 19–32)
Calcium: 9.7 mg/dL (ref 8.4–10.5)
Chloride: 102 mEq/L (ref 96–112)
Creatinine, Ser: 0.94 mg/dL (ref 0.50–1.10)
GFR calc Af Amer: >90 mL/min (ref >90)
GFR calc non Af Amer: 86 mL/min — ABNORMAL LOW (ref >90)
Glucose, Bld: 93 mg/dL (ref 70–99)
Potassium: 4.3 mEq/L (ref 3.7–5.3)
Sodium: 138 mEq/L (ref 137–147)
Total Bilirubin: 0.6 mg/dL (ref 0.3–1.2)
Total Protein: 7.8 g/dL (ref 6.0–8.3)

## 2013-10-17 LAB — LIPASE, BLOOD: Lipase: 27 U/L (ref 11–59)

## 2013-10-17 LAB — URINE MICROSCOPIC-ADD ON

## 2013-10-17 LAB — CBC
HCT: 38.4 % (ref 36.0–46.0)
Hemoglobin: 13.3 g/dL (ref 12.0–15.0)
MCH: 30 pg (ref 26.0–34.0)
MCHC: 34.6 g/dL (ref 30.0–36.0)
MCV: 86.5 fL (ref 78.0–100.0)
Platelets: 220 10*3/uL (ref 150–400)
RBC: 4.44 MIL/uL (ref 3.87–5.11)
RDW: 12.9 % (ref 11.5–15.5)
WBC: 6.2 10*3/uL (ref 4.0–10.5)

## 2013-10-17 LAB — URINALYSIS, ROUTINE W REFLEX MICROSCOPIC
Bilirubin Urine: NEGATIVE
Glucose, UA: NEGATIVE mg/dL
Ketones, ur: NEGATIVE mg/dL
Nitrite: NEGATIVE
Protein, ur: 100 mg/dL — AB
Specific Gravity, Urine: 1.011 (ref 1.005–1.030)
Urobilinogen, UA: 0.2 mg/dL (ref 0.0–1.0)
pH: 6 (ref 5.0–8.0)

## 2013-10-17 LAB — WET PREP, GENITAL
Clue Cells Wet Prep HPF POC: NONE SEEN
Trich, Wet Prep: NONE SEEN
WBC, Wet Prep HPF POC: NONE SEEN
Yeast Wet Prep HPF POC: NONE SEEN

## 2013-10-17 LAB — HIV ANTIBODY (ROUTINE TESTING W REFLEX): HIV 1&2 Ab, 4th Generation: NONREACTIVE

## 2013-10-17 LAB — PREGNANCY, URINE: Preg Test, Ur: NEGATIVE

## 2013-10-17 MED ORDER — IOHEXOL 300 MG/ML  SOLN
100.0000 mL | Freq: Once | INTRAMUSCULAR | Status: AC | PRN
Start: 2013-10-17 — End: 2013-10-17
  Administered 2013-10-17: 100 mL via INTRAVENOUS

## 2013-10-17 MED ORDER — CEPHALEXIN 500 MG PO CAPS: 500.0000 mg | ORAL_CAPSULE | Freq: Three times a day (TID) | ORAL | Status: DC

## 2013-10-17 MED ORDER — MORPHINE SULFATE 4 MG/ML IJ SOLN
4.0000 mg | Freq: Once | INTRAMUSCULAR | Status: AC
  Administered 2013-10-17: 4 mg via INTRAVENOUS
  Filled 2013-10-17: qty 1

## 2013-10-17 MED ORDER — IOHEXOL 300 MG/ML  SOLN
50.0000 mL | Freq: Once | INTRAMUSCULAR | Status: AC | PRN
  Administered 2013-10-17: 50 mL via ORAL

## 2013-10-17 MED ORDER — ONDANSETRON HCL 4 MG/2ML IJ SOLN
4.0000 mg | Freq: Once | INTRAMUSCULAR | Status: AC
  Administered 2013-10-17: 4 mg via INTRAVENOUS
  Filled 2013-10-17: qty 2

## 2013-10-17 NOTE — Discharge Instructions (Signed)
Return to the ED with any concerns including vomiting and not able to keep down medications or fluids, fever/chills, worsening abdominal pain, decreased level of alertness/lethargy, or any other alarming symptoms

## 2013-10-17 NOTE — ED Notes (Signed)
Pt c/o R side flank pain and increased urinary frequency x "a couple days."  Pains score 9/10.  Pt reports previously having these symptoms and being diagnosed with a UTI and cyst on R kidney.

## 2013-10-17 NOTE — ED Notes (Signed)
Pt would like labs pulled from line -  RN aware.  

## 2013-10-17 NOTE — ED Provider Notes (Signed)
CSN: [id number]     Arrival date & time 10/17/13  4098 History   First MD Initiated Contact with Patient 10/17/13 [phone]     Chief Complaint  Patient presents with  . Flank Pain     (Consider location/radiation/quality/duration/timing/severity/associated sxs/prior Treatment) HPI Pt presents with c/o right sided lower abdominal pain.  Per triage note she c/o flank pain, but to me she states pain was all in lower right abdomen.  She states pain began 2 days ago.  Associated with urinary frequency.  Denies dysuria.  Pt states she has had similar symptoms in the past and at that time had a UTI.  No blood in urine.  Denies vaginal discharge, no vaginal bleeding.  No vomiting.   There are no other associated systemic symptoms, there are no other alleviating or modifying factors.   History reviewed. No pertinent past medical history. History reviewed. No pertinent past surgical history. Family History  Problem Relation Age of Onset  . Heart disease Father    History  Substance Use Topics  . Smoking status: Never Smoker   . Smokeless tobacco: Not on file  . Alcohol Use: Yes     Comment: occ   OB History   Grav Para Term Preterm Abortions TAB SAB Ect Mult Living   0 0 0 0 0 0 0 0 0 0      Review of Systems ROS reviewed and all otherwise negative except for mentioned in HPI    Allergies  Review of patient's allergies indicates no known allergies.  Home Medications   Prior to Admission medications   Medication Sig Start Date End Date Taking? Authorizing Provider  Multiple Vitamin (MULTIVITAMIN WITH MINERALS) TABS tablet Take 1 tablet by mouth daily.   Yes Historical Provider, MD  cephALEXin (KEFLEX) 500 MG capsule Take 1 capsule (500 mg total) by mouth 3 (three) times daily. 10/17/13   Ethelda Chick, MD  medroxyPROGESTERone (DEPO-PROVERA) 150 MG/ML injection Inject 1 mL (150 mg total) into the muscle every 3 (three) months. 12/05/12   Brock Bad, MD   BP 120/78  Pulse 72   Temp(Src) 98.5 F (36.9 C) (Oral)  Resp 20  SpO2 98% Vitals reviewed Physical Exam Physical Examination: General appearance - alert, well appearing, and in no distress Mental status - alert, oriented to person, place, and time Eyes - no conjunctival injection, no scleral icterus Mouth - mucous membranes moist, pharynx normal without lesions Chest - clear to auscultation, no wheezes, rales or rhonchi, symmetric air entry Heart - normal rate, regular rhythm, normal S1, S2, no murmurs, rubs, clicks or gallops Abdomen - soft, mild ttp in right lower abdomen, no gaurding or rebound, nabs nondistended, no masses or organomegaly Pelvic - normal external genitalia, vulva, vagina, cervix, uterus and adnexa, no CMT, no adnexal tenderness Extremities - peripheral pulses normal, no pedal edema, no clubbing or cyanosis Skin - normal coloration and turgor, no rashes  ED Course  Procedures (including critical care time) Labs Review Labs Reviewed  URINALYSIS, ROUTINE W REFLEX MICROSCOPIC - Abnormal; Notable for the following:    APPearance TURBID (*)    Hgb urine dipstick MODERATE (*)    Protein, ur 100 (*)    Leukocytes, UA LARGE (*)    All other components within normal limits  COMPREHENSIVE METABOLIC PANEL - Abnormal; Notable for the following:    GFR calc non Af Amer 86 (*)    All other components within normal limits  GC/CHLAMYDIA PROBE AMP  WET PREP, GENITAL  URINE CULTURE  PREGNANCY, URINE  CBC  LIPASE, BLOOD  URINE MICROSCOPIC-ADD ON  HIV ANTIBODY (ROUTINE TESTING)    Imaging Review Ct Abdomen Pelvis W Contrast  10/17/2013   CLINICAL DATA:  Right lower abdominal pain, right flank pain  EXAM: CT ABDOMEN AND PELVIS WITH CONTRAST  TECHNIQUE: Multidetector CT imaging of the abdomen and pelvis was performed using the standard protocol following bolus administration of intravenous contrast.  CONTRAST:  50mL OMNIPAQUE IOHEXOL 300 MG/ML SOLN, 100mL OMNIPAQUE IOHEXOL 300 MG/ML SOLN   COMPARISON:  03/29/2012  FINDINGS: Lung bases are unremarkable. Sagittal images of the spine are unremarkable. Liver, pancreas, spleen and adrenals are unremarkable. Kidneys are symmetrical in size and enhancement. No hydronephrosis or hydroureter.  Abdominal aorta is unremarkable. Moderate stool noted in hepatic flexure of the colon and transverse colon. No pericecal inflammation. Normal appendix. The visualized terminal ileum is unremarkable.  No small bowel obstruction.  No ascites or free air.  No adenopathy.  No adnexal mass is noted. The uterus is unremarkable. The urinary bladder is unremarkable. No inguinal adenopathy. No destructive bony lesions are noted within pelvis.  Bilateral distal ureter is unremarkable.  IMPRESSION: 1. No acute inflammatory process within abdomen or pelvis. 2. No pericecal inflammation.  Normal appendix. 3. No adnexal mass.   Electronically Signed   By: Natasha MeadLiviu  Pop M.D.   On: 10/17/2013 11:23     EKG Interpretation None      MDM   Final diagnoses:  UTI (lower urinary tract infection)    Pt presenting with c/o right lower abdomimal pain.  Workup is essentially unremarkable, no appendicitis on CT scan, doubt renal stone- no blood in urine and no hydro on CT scan.  Pelvic exam is unremarkable, no findings to suggest CMT.  Urine culture sent.  Will start on keflex for possible UTI.  Discharged with strict return precautions.  Pt agreeable with plan. Nursing notes including past medical history and social history reviewed and considered in documentation Prior records reviewed and considered during this visit     Ethelda ChickMartha K Linker, MD 10/19/13 1043

## 2013-10-18 LAB — GC/CHLAMYDIA PROBE AMP
CT Probe RNA: NEGATIVE
GC Probe RNA: NEGATIVE

## 2013-10-21 LAB — URINE CULTURE: Colony Count: >=100000

## 2013-10-22 ENCOUNTER — Telehealth (HOSPITAL_BASED_OUTPATIENT_CLINIC_OR_DEPARTMENT_OTHER): Payer: Self-pay | Admitting: Emergency Medicine

## 2013-10-22 NOTE — Telephone Encounter (Signed)
Post ED Visit - Positive Culture Follow-up  Culture report reviewed by antimicrobial stewardship pharmacist:  Wes Dulaney, Pharm.D., BCPS  Celedonio Miyamoto, Pharm.D., BCPS  Georgina Pillion, Pharm.D., BCPS  [place], [address].D., BCPS, AAHIVP  Estella Husk, Pharm.D., BCPS, AAHIVP  Red Christians, Pharm.D.  Tennis Must, [place].D.  Positive urine culture >100,000 colonies/ml E. Coli Treated with cephalexin  po caps 1 capsule tid x 10 days  organism sensitive to the same and no further patient follow-up is required at this time.  Berle Mull 10/22/2013, 1:38 PM

## 2013-11-18 ENCOUNTER — Ambulatory Visit: Payer: Medicaid Other

## 2014-03-11 ENCOUNTER — Inpatient Hospital Stay (HOSPITAL_COMMUNITY)
Admission: AD | Admit: 2014-03-11 | Discharge: 2014-03-11 | Disposition: A | Discharge status: Home / Self Care | Payer: Medicaid Other | Source: Ambulatory Visit | Attending: Obstetrics | Admitting: Obstetrics

## 2014-03-11 ENCOUNTER — Encounter (HOSPITAL_COMMUNITY): Payer: Self-pay | Admitting: *Deleted

## 2014-03-11 DIAGNOSIS — R109 Unspecified abdominal pain: Secondary | ICD-10-CM | POA: Insufficient documentation

## 2014-03-11 DIAGNOSIS — N912 Amenorrhea, unspecified: Secondary | ICD-10-CM | POA: Insufficient documentation

## 2014-03-11 DIAGNOSIS — N390 Urinary tract infection, site not specified: Secondary | ICD-10-CM

## 2014-03-11 HISTORY — DX: Other specified health status: Z78.9

## 2014-03-11 LAB — CBC
HCT: 39.2 % (ref 36.0–46.0)
Hemoglobin: 13.4 g/dL (ref 12.0–15.0)
MCH: 29.8 pg (ref 26.0–34.0)
MCHC: 34.2 g/dL (ref 30.0–36.0)
MCV: 87.1 fL (ref 78.0–100.0)
Platelets: 239 10*3/uL (ref 150–400)
RBC: 4.5 MIL/uL (ref 3.87–5.11)
RDW: 13.4 % (ref 11.5–15.5)
WBC: 5 10*3/uL (ref 4.0–10.5)

## 2014-03-11 LAB — URINALYSIS, ROUTINE W REFLEX MICROSCOPIC
Bilirubin Urine: NEGATIVE
Glucose, UA: NEGATIVE mg/dL
Hgb urine dipstick: NEGATIVE
Ketones, ur: NEGATIVE mg/dL
Nitrite: POSITIVE — AB
Protein, ur: NEGATIVE mg/dL
Specific Gravity, Urine: 1.025 (ref 1.005–1.030)
Urobilinogen, UA: 0.2 mg/dL (ref 0.0–1.0)
pH: 5.5 (ref 5.0–8.0)

## 2014-03-11 LAB — URINE MICROSCOPIC-ADD ON: RBC / HPF: NONE SEEN RBC/hpf (ref <3)

## 2014-03-11 LAB — HCG, SERUM, QUALITATIVE: Preg, Serum: NEGATIVE

## 2014-03-11 LAB — WET PREP, GENITAL
Clue Cells Wet Prep HPF POC: NONE SEEN
Trich, Wet Prep: NONE SEEN
Yeast Wet Prep HPF POC: NONE SEEN

## 2014-03-11 LAB — POCT PREGNANCY, URINE: Preg Test, Ur: NEGATIVE

## 2014-03-11 MED ORDER — CEFTRIAXONE SODIUM 250 MG IJ SOLR
250.0000 mg | Freq: Once | INTRAMUSCULAR | Status: DC
  Filled 2014-03-11: qty 250

## 2014-03-11 MED ORDER — NORGESTIMATE-ETH ESTRADIOL 0.25-35 MG-MCG PO TABS: 1.0000 | ORAL_TABLET | Freq: Every day | ORAL | Status: DC

## 2014-03-11 MED ORDER — AZITHROMYCIN 250 MG PO TABS
1000.0000 mg | ORAL_TABLET | Freq: Once | ORAL | Status: DC
  Filled 2014-03-11: qty 4

## 2014-03-11 MED ORDER — CEPHALEXIN 500 MG PO CAPS: 500.0000 mg | ORAL_CAPSULE | Freq: Four times a day (QID) | ORAL | Status: DC

## 2014-03-11 NOTE — MAU Provider Note (Signed)
History     CSN: [id number]  Arrival date and time: 03/11/14 1420   None     Chief Complaint  Patient presents with  . Abdominal Pain  . Nausea   HPIpt is not pregnant but c/o of breast tenderness and bloating feeling for about 1 month and wants serum HCG. Pt also c/o vaginal discharge and discomfort with IC, which is new for pt. Pt was on Depo with last injection due in September 2015.  Pt is not using contraception at this time. Pt was amenorrhea on Depo and continues. Of note- pt's mother died suddenly with seizure? at age 58 Jan 2015; father not living  Registered Nurse Signed  MAU Note 03/11/2014 3:08 PM    Expand All Collapse All   Pt was on depo, was due in September, pt didn't get dose. Has breast tenderness, nauseated, spotted 2 days ago, none now. Lower abd pain for the last month. Neg HPT 2 weeks ago       Past Medical History  Diagnosis Date  . Medical history non-contributory     History reviewed. No pertinent past surgical history.  Family History  Problem Relation Age of Onset  . Heart disease Father     History  Substance Use Topics  . Smoking status: Never Smoker   . Smokeless tobacco: Not on file  . Alcohol Use: Yes     Comment: occ    Allergies: No Known Allergies  Prescriptions prior to admission  Medication Sig Dispense Refill Last Dose  . cephALEXin (KEFLEX) 500 MG capsule Take 1 capsule (500 mg total) by mouth 3 (three) times daily. 30 capsule 0   . medroxyPROGESTERone (DEPO-PROVERA) 150 MG/ML injection Inject 1 mL (150 mg total) into the muscle every 3 (three) months. 1 mL 3 07/2013  . Multiple Vitamin (MULTIVITAMIN WITH MINERALS) TABS tablet Take 1 tablet by mouth daily.   Past Week at Unknown time    ROS Physical Exam   Blood pressure 140/89, pulse 78, resp. rate 18, height 5\' 4"  (1.626 m), weight 165 lb 6 oz (75.014 kg), SpO2 100 %.  Physical Exam  Vitals reviewed. Constitutional: She is oriented to person, place, and time.  She appears well-developed and well-nourished. No distress.  HENT:  Head: Normocephalic.  Eyes: Pupils are equal, round, and reactive to light.  Neck: Normal range of motion. Neck supple.  Cardiovascular: Normal rate.   BP recheck 126/70  Respiratory: Effort normal.  GI: Soft. She exhibits no distension. There is no tenderness. There is no rebound.  Genitourinary: Vagina normal and uterus normal.  Bimanual diffusely tender; no rebound  Musculoskeletal: Normal range of motion.  Neurological: She is alert and oriented to person, place, and time.  Skin: Skin is warm and dry.  Psychiatric: She has a normal mood and affect.    MAU Course  Procedures Results for orders placed or performed during the hospital encounter of 03/11/14 (from the past 24 hour(s))  Urinalysis, Routine w reflex microscopic     Status: Abnormal   Collection Time: 03/11/14  3:11 PM  Result Value Ref Range   Color, Urine YELLOW YELLOW   APPearance CLEAR CLEAR   Specific Gravity, Urine 1.025 1.005 - 1.030   pH 5.5 5.0 - 8.0   Glucose, UA NEGATIVE NEGATIVE mg/dL   Hgb urine dipstick NEGATIVE NEGATIVE   Bilirubin Urine NEGATIVE NEGATIVE   Ketones, ur NEGATIVE NEGATIVE mg/dL   Protein, ur NEGATIVE NEGATIVE mg/dL   Urobilinogen, UA 0.2 0.0 - 1.0  mg/dL   Nitrite POSITIVE (A) NEGATIVE   Leukocytes, UA MODERATE (A) NEGATIVE  Urine microscopic-add on     Status: Abnormal   Collection Time: 03/11/14  3:11 PM  Result Value Ref Range   Squamous Epithelial / LPF RARE RARE   WBC, UA 11-20 <3 WBC/hpf   RBC / HPF  <3 RBC/hpf    NO FORMED ELEMENTS SEEN ON URINE MICROSCOPIC EXAMINATION   Bacteria, UA MANY (A) RARE  Pregnancy, urine POC     Status: None   Collection Time: 03/11/14  3:26 PM  Result Value Ref Range   Preg Test, Ur NEGATIVE NEGATIVE  hCG, serum, qualitative     Status: None   Collection Time: 03/11/14  3:53 PM  Result Value Ref Range   Preg, Serum NEGATIVE NEGATIVE  CBC     Status: None   Collection  Time: 03/11/14  3:53 PM  Result Value Ref Range   WBC 5.0 4.0 - 10.5 K/uL   RBC 4.50 3.87 - 5.11 MIL/uL   Hemoglobin 13.4 12.0 - 15.0 g/dL   HCT 16.1 09.6 - 04.5 %   MCV 87.1 78.0 - 100.0 fL   MCH 29.8 26.0 - 34.0 pg   MCHC 34.2 30.0 - 36.0 g/dL   RDW 40.9 81.1 - 91.4 %   Platelets 239 150 - 400 K/uL  Wet prep, genital     Status: Abnormal   Collection Time: 03/11/14  4:30 PM  Result Value Ref Range   Yeast Wet Prep HPF POC NONE SEEN NONE SEEN   Trich, Wet Prep NONE SEEN NONE SEEN   Clue Cells Wet Prep HPF POC NONE SEEN NONE SEEN   WBC, Wet Prep HPF POC FEW (A) NONE SEEN  GC and chlamydia pending Urine C&S pending Pt does not want to use Depo; options discussed- pt willing to try OC- instructions on use explained Pt to report severe headache or sudden onset of sharp abdominal pain Assessment and Plan  UTI- Keflex  TID for 10 days Sprintec 1 daily #1 pack [id number] f/u with [facility]-watch BP; report any headaches Grief/Grieving- recommended Hospice counseling  Damyra Luscher 03/11/2014, 4:22 PM

## 2014-03-11 NOTE — MAU Note (Signed)
Pt was on depo, was due in September, pt didn't get dose.  Has breast tenderness, nauseated, spotted 2 days ago, none now.  Lower abd pain for the last month.  Neg HPT 2 weeks ago.

## 2014-03-12 LAB — HIV ANTIBODY (ROUTINE TESTING W REFLEX)
HIV 1/O/2 Abs-Index Value: <1 (ref <1.00)
HIV-1/HIV-2 Ab: NONREACTIVE

## 2014-03-12 LAB — GC/CHLAMYDIA PROBE AMP
CT Probe RNA: POSITIVE — AB
GC Probe RNA: NEGATIVE

## 2014-03-12 LAB — RPR: RPR Ser Ql: NONREACTIVE

## 2014-03-14 LAB — URINE CULTURE: Colony Count: >=100000

## 2014-06-27 ENCOUNTER — Inpatient Hospital Stay (HOSPITAL_COMMUNITY)
Admission: AD | Admit: 2014-06-27 | Discharge: 2014-06-27 | Disposition: A | Discharge status: Home / Self Care | Payer: Medicaid Other | Source: Ambulatory Visit | Attending: Obstetrics | Admitting: Obstetrics

## 2014-06-27 ENCOUNTER — Encounter (HOSPITAL_COMMUNITY): Payer: Self-pay | Admitting: *Deleted

## 2014-06-27 DIAGNOSIS — N939 Abnormal uterine and vaginal bleeding, unspecified: Secondary | ICD-10-CM

## 2014-06-27 LAB — URINALYSIS, ROUTINE W REFLEX MICROSCOPIC
Bilirubin Urine: NEGATIVE
Glucose, UA: NEGATIVE mg/dL
Ketones, ur: NEGATIVE mg/dL
Leukocytes, UA: NEGATIVE
Nitrite: NEGATIVE
Protein, ur: NEGATIVE mg/dL
Specific Gravity, Urine: 1.02 (ref 1.005–1.030)
Urobilinogen, UA: 0.2 mg/dL (ref 0.0–1.0)
pH: 6 (ref 5.0–8.0)

## 2014-06-27 LAB — CBC
HCT: 39.5 % (ref 36.0–46.0)
Hemoglobin: 13.7 g/dL (ref 12.0–15.0)
MCH: 30.6 pg (ref 26.0–34.0)
MCHC: 34.7 g/dL (ref 30.0–36.0)
MCV: 88.2 fL (ref 78.0–100.0)
Platelets: 264 10*3/uL (ref 150–400)
RBC: 4.48 MIL/uL (ref 3.87–5.11)
RDW: 13.2 % (ref 11.5–15.5)
WBC: 7.1 10*3/uL (ref 4.0–10.5)

## 2014-06-27 LAB — URINE MICROSCOPIC-ADD ON

## 2014-06-27 LAB — POCT PREGNANCY, URINE: Preg Test, Ur: NEGATIVE

## 2014-06-27 LAB — WET PREP, GENITAL
Trich, Wet Prep: NONE SEEN
Yeast Wet Prep HPF POC: NONE SEEN

## 2014-06-27 NOTE — MAU Provider Note (Signed)
History     CSN: [id number]  Arrival date and time: 06/27/14 1332   First Provider Initiated Contact with Patient 06/27/14 1406      Chief Complaint  Patient presents with  . Vaginal Bleeding   HPI Kristen [name] 23 y.o. G0P0000 nonpregnant female presents to MAU complaining of extended menses.  She started bleeding 3 weeks ago and has had heavy to light bleeding daily since then.  She has also noticed an odor lately.  It can be bright red to dark brown. She sometimes has cramping with it.  No aggravating or alleviating factors.  She stopped OCP 3 months ago and stopped depo a year ago.  She denies abdominal pain, back pain, dysuria, nausea, vomiting, fever, weakness.   OB History    Gravida Para Term Preterm AB TAB SAB Ectopic Multiple Living        Past Medical History  Diagnosis Date  . Medical history non-contributory     History reviewed. No pertinent past surgical history.  Family History  Problem Relation Age of Onset  . Heart disease Father     History  Substance Use Topics  . Smoking status: Never Smoker   . Smokeless tobacco: Not on file  . Alcohol Use: Yes     Comment: occ    Allergies: No Known Allergies  Prescriptions prior to admission  Medication Sig Dispense Refill Last Dose  . Multiple Vitamin (MULTIVITAMIN WITH MINERALS) TABS tablet Take 1 tablet by mouth daily.   Past Week at Unknown time  . cephALEXin (KEFLEX) 500 MG capsule Take 1 capsule (500 mg total) by mouth 4 (four) times daily. (Patient not taking: Reported on 06/27/2014) 40 capsule 0 Not Taking at Unknown time  . norgestimate-ethinyl estradiol (ORTHO-CYCLEN,SPRINTEC,PREVIFEM) 0.25-35 MG-MCG tablet Take 1 tablet by mouth daily. (Patient not taking: Reported on 06/27/2014) 1 Package 11 Not Taking at Unknown time    ROS Pertinent ROS in HPI.  All other systems are negative.   Physical Exam   Blood pressure 124/79, pulse 93, temperature 98.7 F (37.1 C), resp. rate 16,  weight 167 lb 9.6 oz (76.023 kg), last menstrual period 06/13/2014.  Physical Exam  Constitutional: She is oriented to person, place, and time. She appears well-developed and well-nourished. No distress.  HENT:  Head: Normocephalic and atraumatic.  Eyes: EOM are normal.  Neck: Normal range of motion.  Cardiovascular: Normal rate and regular rhythm.   Respiratory: Effort normal and breath sounds normal. No respiratory distress.  GI: Soft. Bowel sounds are normal. She exhibits no distension and no mass. There is no tenderness. There is no rebound and no guarding.  Musculoskeletal: Normal range of motion.  Neurological: She is alert and oriented to person, place, and time.  Skin: Skin is warm and dry.  Psychiatric: She has a normal mood and affect.   Results for orders placed or performed during the hospital encounter of 06/27/14 (from the past 24 hour(s))  Urinalysis, Routine w reflex microscopic     Status: Abnormal   Collection Time: 06/27/14  1:40 PM  Result Value Ref Range   Color, Urine YELLOW YELLOW   APPearance CLEAR CLEAR   Specific Gravity, Urine 1.020 1.005 - 1.030   pH 6.0 5.0 - 8.0   Glucose, UA NEGATIVE NEGATIVE mg/dL   Hgb urine dipstick LARGE (A) NEGATIVE   Bilirubin Urine NEGATIVE NEGATIVE   Ketones, ur NEGATIVE NEGATIVE mg/dL   Protein, ur NEGATIVE NEGATIVE mg/dL  Urobilinogen, UA 0.2 0.0 - 1.0 mg/dL   Nitrite NEGATIVE NEGATIVE   Leukocytes, UA NEGATIVE NEGATIVE  Urine microscopic-add on     Status: Abnormal   Collection Time: 06/27/14  1:40 PM  Result Value Ref Range   Squamous Epithelial / LPF RARE RARE   WBC, UA 0-2 <3 WBC/hpf   RBC / HPF 21-50 <3 RBC/hpf   Bacteria, UA MANY (A) RARE   Urine-Other MUCOUS PRESENT   Wet prep, genital     Status: Abnormal   Collection Time: 06/27/14  2:15 PM  Result Value Ref Range   Yeast Wet Prep HPF POC NONE SEEN NONE SEEN   Trich, Wet Prep NONE SEEN NONE SEEN   Clue Cells Wet Prep HPF POC FEW (A) NONE SEEN   WBC, Wet  Prep HPF POC MODERATE (A) NONE SEEN  CBC     Status: None   Collection Time: 06/27/14  2:20 PM  Result Value Ref Range   WBC 7.1 4.0 - 10.5 K/uL   RBC 4.48 3.87 - 5.11 MIL/uL   Hemoglobin 13.7 12.0 - 15.0 g/dL   HCT 29.539.5 62.136.0 - 30.846.0 %   MCV 88.2 78.0 - 100.0 fL   MCH 30.6 26.0 - 34.0 pg   MCHC 34.7 30.0 - 36.0 g/dL   RDW 65.713.2 84.611.5 - 96.215.5 %   Platelets 264 150 - 400 K/uL  Pregnancy, urine POC     Status: None   Collection Time: 06/27/14  2:23 PM  Result Value Ref Range   Preg Test, Ur NEGATIVE NEGATIVE    MAU Course  Procedures  MDM Hemodynamically stable No definitive infection  Assessment and Plan  A:  1. Abnormal vaginal bleeding     P: Discharge to home STD tests are pending Restart OCP 1 po qd.  Already has rx.   Use condoms for backup contraception.  Follow up in office with Dr. Clearance CootsHarper Patient may return to MAU as needed or if her condition were to change or worsen   Bertram Denvereague Clark, Karen E 06/27/2014, 2:06 PM

## 2014-06-27 NOTE — Discharge Instructions (Signed)

## 2014-06-27 NOTE — MAU Note (Signed)
Pt started menstrual period on 06/13/2014. Bleeding on and off, brown to Bright red for 3 weeks. Denies pain, denies discharge, N/V.

## 2014-06-27 NOTE — MAU Note (Signed)
Pt stopped taking Birth Control pill two months ago.

## 2014-06-30 LAB — GC/CHLAMYDIA PROBE AMP (~~LOC~~) NOT AT ARMC
Chlamydia: NEGATIVE
Neisseria Gonorrhea: NEGATIVE

## 2014-09-30 ENCOUNTER — Encounter (HOSPITAL_COMMUNITY): Payer: Self-pay | Admitting: *Deleted

## 2014-09-30 ENCOUNTER — Inpatient Hospital Stay (HOSPITAL_COMMUNITY)
Admission: AD | Admit: 2014-09-30 | Discharge: 2014-09-30 | Disposition: A | Discharge status: Home / Self Care | Payer: Medicaid Other | Source: Ambulatory Visit | Attending: Obstetrics | Admitting: Obstetrics

## 2014-09-30 DIAGNOSIS — B9689 Other specified bacterial agents as the cause of diseases classified elsewhere: Secondary | ICD-10-CM

## 2014-09-30 DIAGNOSIS — N760 Acute vaginitis: Secondary | ICD-10-CM | POA: Insufficient documentation

## 2014-09-30 DIAGNOSIS — N926 Irregular menstruation, unspecified: Secondary | ICD-10-CM | POA: Insufficient documentation

## 2014-09-30 DIAGNOSIS — A499 Bacterial infection, unspecified: Secondary | ICD-10-CM

## 2014-09-30 LAB — URINALYSIS, ROUTINE W REFLEX MICROSCOPIC
Bilirubin Urine: NEGATIVE
Glucose, UA: NEGATIVE mg/dL
Ketones, ur: NEGATIVE mg/dL
Leukocytes, UA: NEGATIVE
Nitrite: NEGATIVE
Protein, ur: NEGATIVE mg/dL
Specific Gravity, Urine: 1.02 (ref 1.005–1.030)
Urobilinogen, UA: 0.2 mg/dL (ref 0.0–1.0)
pH: 6.5 (ref 5.0–8.0)

## 2014-09-30 LAB — WET PREP, GENITAL
Trich, Wet Prep: NONE SEEN
Yeast Wet Prep HPF POC: NONE SEEN

## 2014-09-30 LAB — URINE MICROSCOPIC-ADD ON

## 2014-09-30 LAB — POCT PREGNANCY, URINE: Preg Test, Ur: NEGATIVE

## 2014-09-30 MED ORDER — METRONIDAZOLE 500 MG PO TABS: 500.0000 mg | ORAL_TABLET | Freq: Two times a day (BID) | ORAL | Status: DC

## 2014-09-30 NOTE — MAU Provider Note (Signed)
History     CSN: [id number]  Arrival date and time: 09/30/14 1107   First Provider Initiated Contact with Patient 09/30/14 1220      Chief Complaint  Patient presents with  . Period Issues    HPI   Ms. Kristen [name] is a 24 y.o. female G0P0000 presenting to MAU with irregular vaginal bleeding.   Patient presents with irregular menstrual cycles. This has been going on ever since she stopped depo which was almost a year ago. When she was on depo she was not having periods, since she has stopped depo her periods have been irregular. Her cycle varies from 3-30 days of bleeding. Her last period was from July 16-19 and then she restarted bleeding on the 22nd and has stopped now.   She is currently using birth control pills; she takes them everyday and has been on these for a couple of months.   OB History    Gravida Para Term Preterm AB TAB SAB Ectopic Multiple Living   0 0 0 0 0 0 0 0 0 0       Past Medical History  Diagnosis Date  . Medical history non-contributory     Past Surgical History  Procedure Laterality Date  . No past surgeries      Family History  Problem Relation Age of Onset  . Heart disease Father     History  Substance Use Topics  . Smoking status: Never Smoker   . Smokeless tobacco: Not on file  . Alcohol Use: Yes     Comment: occ    Allergies: No Known Allergies  Prescriptions prior to admission  Medication Sig Dispense Refill Last Dose  . Multiple Vitamin (MULTIVITAMIN WITH MINERALS) TABS tablet Take 1 tablet by mouth daily.   09/30/2014 at Unknown time  . norgestimate-ethinyl estradiol (ORTHO-CYCLEN,SPRINTEC,PREVIFEM) 0.25-35 MG-MCG tablet Take 1 tablet by mouth daily. (Patient not taking: Reported on 06/27/2014) 1 Package 11 Not Taking at Unknown time   Results for orders placed or performed during the hospital encounter of 09/30/14 (from the past 48 hour(s))  Urinalysis, Routine w reflex microscopic (not at [facility])     Status: Abnormal   Collection Time: 09/30/14 12:02 PM  Result Value Ref Range   Color, Urine YELLOW YELLOW   APPearance CLEAR CLEAR   Specific Gravity, Urine 1.020 1.005 - 1.030   pH 6.5 5.0 - 8.0   Glucose, UA NEGATIVE NEGATIVE mg/dL   Hgb urine dipstick MODERATE (A) NEGATIVE   Bilirubin Urine NEGATIVE NEGATIVE   Ketones, ur NEGATIVE NEGATIVE mg/dL   Protein, ur NEGATIVE NEGATIVE mg/dL   Urobilinogen, UA 0.2 0.0 - 1.0 mg/dL   Nitrite NEGATIVE NEGATIVE   Leukocytes, UA NEGATIVE NEGATIVE  Urine microscopic-add on     Status: Abnormal   Collection Time: 09/30/14 12:02 PM  Result Value Ref Range   Squamous Epithelial / LPF MANY (A) RARE   WBC, UA 0-2 <3 WBC/hpf   RBC / HPF 0-2 <3 RBC/hpf   Bacteria, UA FEW (A) RARE  Pregnancy, urine POC     Status: None   Collection Time: 09/30/14 12:05 PM  Result Value Ref Range   Preg Test, Ur NEGATIVE NEGATIVE    Comment:        THE SENSITIVITY OF THIS METHODOLOGY IS >24 mIU/mL   Wet prep, genital     Status: Abnormal   Collection Time: 09/30/14 12:41 PM  Result Value Ref Range   Yeast Wet Prep HPF POC NONE SEEN NONE SEEN  Trich, Wet Prep NONE SEEN NONE SEEN   Clue Cells Wet Prep HPF POC MANY (A) NONE SEEN   WBC, Wet Prep HPF POC FEW (A) NONE SEEN    Comment: BACTERIA- TOO NUMEROUS TO COUNT    Review of Systems  Gastrointestinal: Negative for abdominal pain.  Genitourinary: Negative for dysuria, urgency and frequency.  Neurological: Negative for dizziness.   Physical Exam   Blood pressure 128/71, pulse 90, temperature 98.8 F (37.1 C), temperature source Oral, resp. rate 16, height  (1.626 m), weight 79.833 kg (176 lb), last menstrual period 09/19/2014.  Physical Exam  Constitutional: She is oriented to person, place, and time. She appears well-developed and well-nourished. No distress.  HENT:  Head: Normocephalic.  Eyes: Pupils are equal, round, and reactive to light.  Neck: Neck supple.  Respiratory: Effort normal.  GI: Soft. She exhibits  no distension. There is no tenderness. There is no rebound.  Genitourinary:  Speculum exam: Vagina - Small amount of creamy discharge, mild odor Cervix - No contact bleeding GC/Chlam, wet prep done Chaperone present for exam.  Musculoskeletal: Normal range of motion.  Neurological: She is alert and oriented to person, place, and time.  Skin: Skin is warm. She is not diaphoretic.  Psychiatric: Her behavior is normal.    MAU Course  Procedures  None  MDM  Patient is not currently bleeding, we discussed doubling her BC pills if the bleeding returns or if she starts having spotting inbetween periods. I also encouraged her to follow up with Dr. Clearance Coots to discuss a plan.   Assessment and Plan   A:  1. Irregular menstrual cycle   2. BV (bacterial vaginosis)    P:  Discharge home in stable condition RX: Flagyl Call Dr. Clearance Coots for follow up Bleeding precautions Continue BC   Duane Lope, NP 09/30/2014 5:18 PM

## 2014-09-30 NOTE — Discharge Instructions (Signed)

## 2014-09-30 NOTE — MAU Note (Signed)
Patient presents stating she is not pregnant but her menstrual cycle keeping recurring.

## 2014-09-30 NOTE — MAU Note (Signed)
Patient states that she started a light cycle on 7/16 for 3 days and her cycle returned on 7/22 and that this pattern keeps repeating itself. Denies pain or bleeding at this time but has a brown discharge.

## 2014-10-01 LAB — GC/CHLAMYDIA PROBE AMP (~~LOC~~) NOT AT ARMC
Chlamydia: NEGATIVE
Neisseria Gonorrhea: NEGATIVE

## 2014-10-04 ENCOUNTER — Encounter (HOSPITAL_COMMUNITY): Payer: Self-pay | Admitting: *Deleted

## 2014-10-04 ENCOUNTER — Emergency Department (HOSPITAL_COMMUNITY)
Admission: EM | Admit: 2014-10-04 | Discharge: 2014-10-04 | Disposition: A | Discharge status: Home / Self Care | Payer: Medicaid Other | Attending: Emergency Medicine | Admitting: Emergency Medicine

## 2014-10-04 DIAGNOSIS — M25561 Pain in right knee: Secondary | ICD-10-CM | POA: Insufficient documentation

## 2014-10-04 DIAGNOSIS — M7989 Other specified soft tissue disorders: Secondary | ICD-10-CM | POA: Insufficient documentation

## 2014-10-04 DIAGNOSIS — M25571 Pain in right ankle and joints of right foot: Secondary | ICD-10-CM | POA: Insufficient documentation

## 2014-10-04 DIAGNOSIS — Z793 Long term (current) use of hormonal contraceptives: Secondary | ICD-10-CM | POA: Insufficient documentation

## 2014-10-04 DIAGNOSIS — M25562 Pain in left knee: Secondary | ICD-10-CM | POA: Insufficient documentation

## 2014-10-04 NOTE — Discharge Instructions (Signed)
Please purchase some compression stockings and wear them while you work.  Please follow-up with your primary care doctor if your symptoms do not improve.  Peripheral Edema You have swelling in your legs (peripheral edema). This swelling is due to excess accumulation of salt and water in your body. Edema may be a sign of heart, kidney or liver disease, or a side effect of a medication. It may also be due to problems in the leg veins. Elevating your legs and using special support stockings may be very helpful, if the cause of the swelling is due to poor venous circulation. Avoid long periods of standing, whatever the cause. Treatment of edema depends on identifying the cause. Chips, pretzels, pickles and other salty foods should be avoided. Restricting salt in your diet is almost always needed. Water pills (diuretics) are often used to remove the excess salt and water from your body via urine. These medicines prevent the kidney from reabsorbing sodium. This increases urine flow. Diuretic treatment may also result in lowering of potassium levels in your body. Potassium supplements may be needed if you have to use diuretics daily. Daily weights can help you keep track of your progress in clearing your edema. You should call your caregiver for follow up care as recommended. SEEK IMMEDIATE MEDICAL CARE IF:   You have increased swelling, pain, redness, or heat in your legs.  You develop shortness of breath, especially when lying down.  You develop chest or abdominal pain, weakness, or fainting.  You have a fever. Document Released: 03/24/2004 Document Revised: 05/09/2011 Document Reviewed: 03/04/2009 [facility] Patient Information 2015 [place], [place]. This information is not intended to replace advice given to you by your health care provider. Make sure you discuss any questions you have with your health care provider.

## 2014-10-04 NOTE — ED Provider Notes (Signed)
CSN: [id number]     Arrival date & time 10/04/14  1218 History  This chart was scribed for Kristen Horseman, PA-C, working with Azalia Bilis, MD by Chestine Spore, ED Scribe. The patient was seen in room TR07C/TR07C at 1:15 PM.     Chief Complaint  Patient presents with  . Knee Pain  . Ankle Pain      The history is provided by the patient. No language interpreter was used.    HPI Comments: Kristen [name] is a 24 y.o. female who presents to the Emergency Department complaining of bilateral knee and ankle pain. Pt reports that she is on her feet a lot at her job. She states that she is having associated symptoms of bilateral knee swelling, bilateral ankle swelling. Pt notes that her right knee and ankle is swollen more than the left. Pt denies any injury. She states that she has tried ice and soaks with no relief for her symptoms. She denies color change, wound, rash, and any other symptoms. Pt denies heart issues or any other medical issues.   Past Medical History  Diagnosis Date  . Medical history non-contributory    Past Surgical History  Procedure Laterality Date  . No past surgeries     Family History  Problem Relation Age of Onset  . Heart disease Father    History  Substance Use Topics  . Smoking status: Never Smoker   . Smokeless tobacco: Not on file  . Alcohol Use: Yes     Comment: occ   OB History    Gravida Para Term Preterm AB TAB SAB Ectopic Multiple Living       Review of Systems  Musculoskeletal: Positive for joint swelling. Negative for myalgias and arthralgias.  Skin: Negative for color change, rash and wound.      Allergies  Review of patient's allergies indicates no known allergies.  Home Medications   Prior to Admission medications   Medication Sig Start Date End Date Taking? Authorizing Provider  metroNIDAZOLE (FLAGYL) 500 MG tablet Take 1 tablet (500 mg total) by mouth 2 (two) times daily. 09/30/14   Duane Lope, NP   Multiple Vitamin (MULTIVITAMIN WITH MINERALS) TABS tablet Take 1 tablet by mouth daily.    Historical Provider, MD  norgestimate-ethinyl estradiol (ORTHO-CYCLEN,SPRINTEC,PREVIFEM) 0.25-35 MG-MCG tablet Take 1 tablet by mouth daily. Patient not taking: Reported on 06/27/2014 03/11/14   Jean Rosenthal, NP   BP 131/68 mmHg  Pulse 82  Temp(Src) 98.4 F (36.9 C) (Oral)  Resp 18  SpO2 99%  LMP 09/19/2014 Physical Exam  Constitutional: She is oriented to person, place, and time. She appears well-developed and well-nourished. No distress.  HENT:  Head: Normocephalic and atraumatic.  Eyes: EOM are normal.  Neck: Neck supple. No tracheal deviation present.  Cardiovascular: Normal rate and intact distal pulses.   Intact distal pulses with brisk capillary refill  Pulmonary/Chest: Effort normal. No respiratory distress.  Musculoskeletal: Normal range of motion.  Mild bilateral peripheral edema, no pitting edema, no posterior calf or popliteal tenderness  Neurological: She is alert and oriented to person, place, and time.  Sensation intact  Skin: Skin is warm and dry.  Psychiatric: She has a normal mood and affect. Her behavior is normal.  Nursing note and vitals reviewed.   ED Course  Procedures (including critical care time) DIAGNOSTIC STUDIES: Oxygen Saturation is 99% on RA, nl by my interpretation.    COORDINATION OF CARE:  1:18 PM-Discussed treatment plan which includes compression stockings with pt at bedside and pt agreed to plan.   Labs Review Labs Reviewed - No data to display  Imaging Review No results found.   EKG Interpretation None      MDM   Final diagnoses:  Leg swelling    Patient with very mild bilateral peripheral edema, no pitting edema, worsened with standing for long hours. Will recommend compression stockings and primary care follow-up. Patient does not have any heart problems. She is stable and ready for discharge.   I personally performed the  services described in this documentation, which was scribed in my presence. The recorded information has been reviewed and is accurate.     Kristen Horseman, PA-C 10/04/14 1331  Azalia Bilis, MD 10/04/14 1630

## 2014-10-04 NOTE — ED Notes (Signed)
Declined W/C at D/C and was escorted to lobby by RN. 

## 2014-10-04 NOTE — ED Notes (Signed)
Pt reports being on her feet a lot at work, having swelling to ankles and knees. Denies any injury. No distress noted at triage.

## 2014-12-13 ENCOUNTER — Encounter (HOSPITAL_COMMUNITY): Payer: Self-pay | Admitting: *Deleted

## 2014-12-13 ENCOUNTER — Inpatient Hospital Stay (HOSPITAL_COMMUNITY)
Admission: AD | Admit: 2014-12-13 | Discharge: 2014-12-13 | Disposition: A | Discharge status: Home / Self Care | Payer: Medicaid Other | Source: Ambulatory Visit | Attending: Obstetrics | Admitting: Obstetrics

## 2014-12-13 DIAGNOSIS — Z3202 Encounter for pregnancy test, result negative: Secondary | ICD-10-CM | POA: Insufficient documentation

## 2014-12-13 DIAGNOSIS — N923 Ovulation bleeding: Secondary | ICD-10-CM

## 2014-12-13 DIAGNOSIS — N926 Irregular menstruation, unspecified: Secondary | ICD-10-CM | POA: Insufficient documentation

## 2014-12-13 LAB — URINALYSIS, ROUTINE W REFLEX MICROSCOPIC
Bilirubin Urine: NEGATIVE
Glucose, UA: NEGATIVE mg/dL
Ketones, ur: NEGATIVE mg/dL
Nitrite: NEGATIVE
Protein, ur: NEGATIVE mg/dL
Specific Gravity, Urine: 1.015 (ref 1.005–1.030)
Urobilinogen, UA: 0.2 mg/dL (ref 0.0–1.0)
pH: 5.5 (ref 5.0–8.0)

## 2014-12-13 LAB — POCT PREGNANCY, URINE: Preg Test, Ur: NEGATIVE

## 2014-12-13 LAB — URINE MICROSCOPIC-ADD ON

## 2014-12-13 NOTE — MAU Provider Note (Signed)
History     CSN: [id number]  Arrival date and time: 12/13/14 1527   First Provider Initiated Contact with Patient 12/13/14 1701      Chief Complaint  Patient presents with  . irregular menstrual cycle    HPI   Ms.Kristen [name] is a 24 y.o. female G0P0000 presenting with irregular vaginal bleeding. On October 1 her period started; LMP prior to that was September 12; she normally has a 28 day cycle.  Her period on Oct 1 started and never stopped. She has been spotting off and on since she started her period on Oct 1  She was seen here a few months ago for this however was not bleeding at the time. [facility] had [facility] changed.  Currently she is having very light spotting; scant amount, and denies pain.  She is tired of spotting in between her periods. STD testing was done at [facility] and was normal.   OB History    Gravida Para Term Preterm AB TAB SAB Ectopic Multiple Living        Past Medical History  Diagnosis Date  . Medical history non-contributory     Past Surgical History  Procedure Laterality Date  . No past surgeries      Family History  Problem Relation Age of Onset  . Heart disease Father     Social History  Substance Use Topics  . Smoking status: Never Smoker   . Smokeless tobacco: None  . Alcohol Use: Yes     Comment: occ    Allergies: No Known Allergies  Prescriptions prior to admission  Medication Sig Dispense Refill Last Dose  . ibuprofen (ADVIL,MOTRIN) 200 MG tablet Take 400 mg by mouth every 6 (six) hours as needed for moderate pain.   12/13/2014 at Unknown time  . levonorgestrel-ethinyl estradiol (AVIANE,ALESSE,LESSINA) 0.1-20 MG-MCG tablet Take 1 tablet by mouth daily.   12/13/2014 at Unknown time  . Multiple Vitamin (MULTIVITAMIN WITH MINERALS) TABS tablet Take 1 tablet by mouth daily.   Past Week at Unknown time  . metroNIDAZOLE (FLAGYL) 500 MG tablet Take 1  tablet (500 mg total) by mouth 2 (two) times daily. 14 tablet 0    Results for orders placed or performed during the hospital encounter of 12/13/14 (from the past 48 hour(s))  Urinalysis, Routine w reflex microscopic (not at [facility])     Status: Abnormal   Collection Time: 12/13/14  3:40 PM  Result Value Ref Range   Color, Urine YELLOW YELLOW   APPearance CLEAR CLEAR   Specific Gravity, Urine 1.015 1.005 - 1.030   pH 5.5 5.0 - 8.0   Glucose, UA NEGATIVE NEGATIVE mg/dL   Hgb urine dipstick SMALL (A) NEGATIVE   Bilirubin Urine NEGATIVE NEGATIVE   Ketones, ur NEGATIVE NEGATIVE mg/dL   Protein, ur NEGATIVE NEGATIVE mg/dL   Urobilinogen, UA 0.2 0.0 - 1.0 mg/dL   Nitrite NEGATIVE NEGATIVE   Leukocytes, UA SMALL (A) NEGATIVE  Urine microscopic-add on     Status: Abnormal   Collection Time: 12/13/14  3:40 PM  Result Value Ref Range   Squamous Epithelial / LPF FEW (A) RARE   WBC, UA 11-20 <3 WBC/hpf   RBC / HPF 0-2 <3 RBC/hpf   Bacteria, UA FEW (A) RARE  Pregnancy, urine POC     Status: None   Collection Time: 12/13/14  4:54 PM  Result Value Ref Range  Preg Test, Ur NEGATIVE NEGATIVE    Comment:        THE SENSITIVITY OF THIS METHODOLOGY IS >24 mIU/mL     Review of Systems  Eyes: Negative for blurred vision.  Gastrointestinal: Negative for abdominal pain.  Genitourinary: Negative for dysuria and urgency.  Neurological: Negative for dizziness.   Physical Exam   Blood pressure 134/77, pulse 64, temperature 98.4 F (36.9 C), resp. rate 18, height 5\' 5"  (1.651 m), weight 172 lb (78.019 kg), last menstrual period 11/29/2014.  Physical Exam  Constitutional: She is oriented to person, place, and time. She appears well-developed and well-nourished. She appears distressed.  HENT:  Head: Normocephalic.  Eyes: Pupils are equal, round, and reactive to light.  Respiratory: Effort normal.  Musculoskeletal: Normal range of motion.  Neurological: She is alert and oriented to person, place,  and time.  Skin: Skin is warm. She is diaphoretic.  Psychiatric: Her behavior is normal.    MAU Course  Procedures  MDM   Assessment and Plan   A:  1. Irregular menstrual cycle   2. Spotting between menses    P:  Discharge home in stable condition Discussed that irregular spotting is normal with BC pills and that she needed to give the new pills more time. I recommended that [facility] in the next few months if the spotting does not stop; the patient has not acute issues at this time Bleeding precautions  Duane LopeJennifer I Prinston Kynard, NP 12/13/2014 5:51 PM

## 2014-12-13 NOTE — Discharge Instructions (Signed)

## 2014-12-13 NOTE — MAU Note (Signed)
Patient presents stating she is not pregnant with c/o that she started her menstrual cycle on the 1st of October and has not stopped.

## 2015-06-28 ENCOUNTER — Inpatient Hospital Stay (HOSPITAL_COMMUNITY)
Admission: AD | Admit: 2015-06-28 | Discharge: 2015-06-28 | Disposition: A | Discharge status: Home / Self Care | Payer: Self-pay | Source: Ambulatory Visit | Attending: Obstetrics and Gynecology | Admitting: Obstetrics and Gynecology

## 2015-06-28 ENCOUNTER — Encounter (HOSPITAL_COMMUNITY): Payer: Self-pay | Admitting: *Deleted

## 2015-06-28 DIAGNOSIS — A499 Bacterial infection, unspecified: Secondary | ICD-10-CM

## 2015-06-28 DIAGNOSIS — B9689 Other specified bacterial agents as the cause of diseases classified elsewhere: Secondary | ICD-10-CM

## 2015-06-28 DIAGNOSIS — N760 Acute vaginitis: Secondary | ICD-10-CM | POA: Insufficient documentation

## 2015-06-28 DIAGNOSIS — N898 Other specified noninflammatory disorders of vagina: Secondary | ICD-10-CM

## 2015-06-28 LAB — WET PREP, GENITAL
Sperm: NONE SEEN
Trich, Wet Prep: NONE SEEN
Yeast Wet Prep HPF POC: NONE SEEN

## 2015-06-28 LAB — URINE MICROSCOPIC-ADD ON: RBC / HPF: NONE SEEN RBC/hpf (ref 0–5)

## 2015-06-28 LAB — URINALYSIS, ROUTINE W REFLEX MICROSCOPIC
Bilirubin Urine: NEGATIVE
Glucose, UA: NEGATIVE mg/dL
Hgb urine dipstick: NEGATIVE
Ketones, ur: NEGATIVE mg/dL
Nitrite: NEGATIVE
Protein, ur: NEGATIVE mg/dL
Specific Gravity, Urine: 1.015 (ref 1.005–1.030)
pH: 5.5 (ref 5.0–8.0)

## 2015-06-28 LAB — POCT PREGNANCY, URINE: Preg Test, Ur: NEGATIVE

## 2015-06-28 MED ORDER — METRONIDAZOLE 500 MG PO TABS: 500.0000 mg | ORAL_TABLET | Freq: Two times a day (BID) | ORAL | Status: DC

## 2015-06-28 NOTE — MAU Provider Note (Signed)
History     CSN: [id number]  Arrival date and time: 06/28/15 1453   None     Chief Complaint  Patient presents with  . Vaginal Discharge   HPI Kristen [name] is 25 y.o. G0P0000 presents for complaint of yellowish discharge described as mucousy that began 2 months ago. Negative for vaginal itching or burning.  Neg for UTI sxs.  1 sexual partner X 1 yr.  Not using contraception.  Used Depo in past, when she stopped she didn't have periods.  Given OCPs to start periods and then periods became irregular.  Denies abdominal pain.  Former patient of Dr. Tamela OddiJackson-Moore.    Past Medical History  Diagnosis Date  . Medical history non-contributory     Past Surgical History  Procedure Laterality Date  . No past surgeries      Family History  Problem Relation Age of Onset  . Heart disease Father     Social History  Substance Use Topics  . Smoking status: Never Smoker   . Smokeless tobacco: None  . Alcohol Use: Yes     Comment: occ    Allergies: No Known Allergies  Prescriptions prior to admission  Medication Sig Dispense Refill Last Dose  . ibuprofen (ADVIL,MOTRIN) 200 MG tablet Take 400 mg by mouth every 6 (six) hours as needed for moderate pain.   Past Month at Unknown time  . Multiple Vitamin (MULTIVITAMIN WITH MINERALS) TABS tablet Take 1 tablet by mouth daily.   Past Week at Unknown time    Review of Systems  Constitutional: Negative for fever and chills.  Gastrointestinal: Negative for nausea, vomiting, abdominal pain, diarrhea and constipation.  Genitourinary: Negative for dysuria, urgency, frequency and hematuria.       Neg for abnormal bleeding.  + for yellow discharge without burning, irritation or itching.  Neurological: Negative for headaches.   Physical Exam   Blood pressure 122/77, pulse 62, temperature 98.4 F (36.9 C), temperature source Oral, height 5\' 5"  (1.651 m), weight 159 lb 6 oz (72.292 kg), last menstrual period 06/23/2015.  Physical Exam  Vitals  reviewed. Constitutional: She is oriented to person, place, and time. She appears well-developed and well-nourished. No distress.  HENT:  Head: Normocephalic.  Neck: Normal range of motion.  Cardiovascular: Normal rate.   Respiratory: Effort normal.  GI: Soft. She exhibits no distension and no mass. There is no tenderness. There is no rebound and no guarding.  Genitourinary: There is no rash, tenderness or lesion on the right labia. There is no rash, tenderness or lesion on the left labia. Uterus is not deviated, not enlarged, not fixed and not tender. Cervix exhibits no discharge. Right adnexum displays no mass, no tenderness and no fullness. Left adnexum displays no mass, no tenderness and no fullness. No erythema, tenderness or bleeding in the vagina. Vaginal discharge (small amount of clear mucous coming from os.  Neg for redness or tenderness) found.  Neurological: She is alert and oriented to person, place, and time.  Skin: Skin is warm and dry.  Psychiatric: She has a normal mood and affect. Her behavior is normal. Thought content normal.   Results for orders placed or performed during the hospital encounter of 06/28/15 (from the past 24 hour(s))  Urinalysis, Routine w reflex microscopic (not at [facility])     Status: Abnormal   Collection Time: 06/28/15  3:03 PM  Result Value Ref Range   Color, Urine YELLOW YELLOW   APPearance CLEAR CLEAR   Specific Gravity, Urine 1.015 1.005 -  1.030   pH 5.5 5.0 - 8.0   Glucose, UA NEGATIVE NEGATIVE mg/dL   Hgb urine dipstick NEGATIVE NEGATIVE   Bilirubin Urine NEGATIVE NEGATIVE   Ketones, ur NEGATIVE NEGATIVE mg/dL   Protein, ur NEGATIVE NEGATIVE mg/dL   Nitrite NEGATIVE NEGATIVE   Leukocytes, UA TRACE (A) NEGATIVE  Urine microscopic-add on     Status: Abnormal   Collection Time: 06/28/15  3:03 PM  Result Value Ref Range   Squamous Epithelial / LPF 0-5 (A) NONE SEEN   WBC, UA 0-5 0 - 5 WBC/hpf   RBC / HPF NONE SEEN 0 - 5 RBC/hpf   Bacteria, UA  MANY (A) NONE SEEN   Urine-Other MUCOUS PRESENT   Pregnancy, urine POC     Status: None   Collection Time: 06/28/15  3:25 PM  Result Value Ref Range   Preg Test, Ur NEGATIVE NEGATIVE  Wet prep, genital     Status: Abnormal   Collection Time: 06/28/15  3:50 PM  Result Value Ref Range   Yeast Wet Prep HPF POC NONE SEEN NONE SEEN   Trich, Wet Prep NONE SEEN NONE SEEN   Clue Cells Wet Prep HPF POC PRESENT (A) NONE SEEN   WBC, Wet Prep HPF POC MODERATE (A) NONE SEEN   Sperm NONE SEEN     MAU Course  Procedures  GC/CHL culture and HIV pending  MDM MSE Exam Lab  Assessment and Plan  A:  Abnormal vaginal discharge     Bacterial Vaginosis  P:  Rx for Flagyl to pharmacy      Encouraged patient to [place] condoms and consider return to Depo or another contraceptive method if pregnancy not desired at this time.    KEY,EVE M 06/28/2015, 4:49 PM

## 2015-06-28 NOTE — MAU Note (Signed)
Pt. States that she has been having non odorous yellow mucous discharge for the last 2 months.  It doesn't have itching or irritation.

## 2015-06-28 NOTE — Discharge Instructions (Signed)

## 2015-06-29 LAB — HIV ANTIBODY (ROUTINE TESTING W REFLEX): HIV Screen 4th Generation wRfx: NONREACTIVE

## 2015-06-29 LAB — GC/CHLAMYDIA PROBE AMP (~~LOC~~) NOT AT ARMC
Chlamydia: NEGATIVE
Neisseria Gonorrhea: POSITIVE — AB

## 2015-06-30 ENCOUNTER — Ambulatory Visit (INDEPENDENT_AMBULATORY_CARE_PROVIDER_SITE_OTHER): Payer: Self-pay

## 2015-06-30 VITALS — BP 121/73 | HR 71

## 2015-06-30 DIAGNOSIS — A549 Gonococcal infection, unspecified: Secondary | ICD-10-CM

## 2015-06-30 DIAGNOSIS — A64 Unspecified sexually transmitted disease: Secondary | ICD-10-CM

## 2015-06-30 MED ORDER — CEFTRIAXONE SODIUM 1 G IJ SOLR
250.0000 mg | INTRAMUSCULAR | Status: DC
  Administered 2015-06-30: 250 mg via INTRAMUSCULAR

## 2015-06-30 MED ORDER — AZITHROMYCIN 250 MG PO TABS
1000.0000 mg | ORAL_TABLET | Freq: Once | ORAL | Status: AC
  Administered 2015-06-30: 1000 mg via ORAL

## 2015-06-30 MED ORDER — CEFTRIAXONE SODIUM 250 MG IJ SOLR: 250.0000 mg | Freq: Once | INTRAMUSCULAR | Status: DC

## 2015-06-30 NOTE — Progress Notes (Signed)
Pt comes into clinic for STI treatment for gonorrhea. Pt tolerated well. She has notified her partner.

## 2016-02-29 NOTE — L&D Delivery Note (Signed)
   Delivery Note Progressed well, noted to be complete and pushed well.  At 2:18 PM a viable female was delivered via Vaginal, Spontaneous Delivery (Presentation:ROA ).  Shoulders delivered without difficulty.  APGAR: 8, 9; weight pending.   Placenta status: spontaneous, grossly intact.  Cord: 3 vessel Pitocin was initiated.  Fundus was noted to be firm.   After inspection of cervix, vaginal walls and perineum, there was a 1st degree perineal laceration that was repaired with 3-0 vicryl and noted to be hemostatic.   She continued to have minimal bleeding and lower uterine segment was with some atony.  She was administered 800 mcg cytotec rectally.  Bleeding resolved.    Anesthesia:  Epidural Episiotomy: None Lacerations: 1st degree Suture Repair: 3.0 vicryl Est. Blood Loss (mL): 525  Mom to postpartum.  Baby to Couplet care / Skin to Skin.   Jearld Lesch Key 10/26/2016, 2:59 PM  I confirm that I have verified the information documented in the resident's note and that I have also personally reperformed the physical exam and all medical decision making activities.  I was gloved and present for entire delivery SVD without incident No difficulty with shoulders  Lacerations as listed above Repair of same supervised by me Aviva Signs, CNM

## 2016-03-22 ENCOUNTER — Encounter: Payer: Self-pay | Admitting: Family Medicine

## 2016-03-22 ENCOUNTER — Ambulatory Visit (INDEPENDENT_AMBULATORY_CARE_PROVIDER_SITE_OTHER): Payer: Medicaid Other | Admitting: *Deleted

## 2016-03-22 DIAGNOSIS — Z3201 Encounter for pregnancy test, result positive: Secondary | ICD-10-CM | POA: Diagnosis present

## 2016-03-22 DIAGNOSIS — Z3200 Encounter for pregnancy test, result unknown: Secondary | ICD-10-CM

## 2016-03-22 LAB — POCT PREGNANCY, URINE: Preg Test, Ur: POSITIVE — AB

## 2016-03-22 NOTE — Progress Notes (Signed)
Pt reports LMP 01/26/16 which gives EDD of 11/01/16. Medication reconciliation completed.

## 2016-04-16 ENCOUNTER — Encounter (HOSPITAL_COMMUNITY): Payer: Self-pay | Admitting: *Deleted

## 2016-04-16 ENCOUNTER — Inpatient Hospital Stay (HOSPITAL_COMMUNITY): Payer: Medicaid Other

## 2016-04-16 ENCOUNTER — Inpatient Hospital Stay (HOSPITAL_COMMUNITY)
Admission: AD | Admit: 2016-04-16 | Discharge: 2016-04-16 | Disposition: A | Discharge status: Home / Self Care | Payer: Medicaid Other | Source: Ambulatory Visit | Attending: Obstetrics and Gynecology | Admitting: Obstetrics and Gynecology

## 2016-04-16 DIAGNOSIS — B373 Candidiasis of vulva and vagina: Secondary | ICD-10-CM | POA: Diagnosis not present

## 2016-04-16 DIAGNOSIS — Z8249 Family history of ischemic heart disease and other diseases of the circulatory system: Secondary | ICD-10-CM | POA: Diagnosis not present

## 2016-04-16 DIAGNOSIS — B3731 Acute candidiasis of vulva and vagina: Secondary | ICD-10-CM

## 2016-04-16 DIAGNOSIS — Z3A10 10 weeks gestation of pregnancy: Secondary | ICD-10-CM | POA: Insufficient documentation

## 2016-04-16 DIAGNOSIS — R109 Unspecified abdominal pain: Secondary | ICD-10-CM | POA: Diagnosis present

## 2016-04-16 DIAGNOSIS — O26891 Other specified pregnancy related conditions, first trimester: Secondary | ICD-10-CM | POA: Diagnosis not present

## 2016-04-16 DIAGNOSIS — L298 Other pruritus: Secondary | ICD-10-CM | POA: Insufficient documentation

## 2016-04-16 LAB — URINALYSIS, ROUTINE W REFLEX MICROSCOPIC
Bilirubin Urine: NEGATIVE
Glucose, UA: NEGATIVE mg/dL
Ketones, ur: NEGATIVE mg/dL
Nitrite: NEGATIVE
Protein, ur: NEGATIVE mg/dL
Specific Gravity, Urine: 1.009 (ref 1.005–1.030)
pH: 7 (ref 5.0–8.0)

## 2016-04-16 LAB — CBC WITH DIFFERENTIAL/PLATELET
Basophils Absolute: 0 10*3/uL (ref 0.0–0.1)
Basophils Relative: 0 %
Eosinophils Absolute: 0 10*3/uL (ref 0.0–0.7)
Eosinophils Relative: 0 %
HCT: 35 % — ABNORMAL LOW (ref 36.0–46.0)
Hemoglobin: 12.4 g/dL (ref 12.0–15.0)
Lymphocytes Relative: 29 %
Lymphs Abs: 2 10*3/uL (ref 0.7–4.0)
MCH: 29.7 pg (ref 26.0–34.0)
MCHC: 35.4 g/dL (ref 30.0–36.0)
MCV: 83.9 fL (ref 78.0–100.0)
Monocytes Absolute: 0.3 10*3/uL (ref 0.1–1.0)
Monocytes Relative: 4 %
Neutro Abs: 4.5 10*3/uL (ref 1.7–7.7)
Neutrophils Relative %: 67 %
Platelets: 239 10*3/uL (ref 150–400)
RBC: 4.17 MIL/uL (ref 3.87–5.11)
RDW: 12.8 % (ref 11.5–15.5)
WBC: 6.8 10*3/uL (ref 4.0–10.5)

## 2016-04-16 LAB — WET PREP, GENITAL
Sperm: NONE SEEN
Trich, Wet Prep: NONE SEEN

## 2016-04-16 LAB — HCG, QUANTITATIVE, PREGNANCY: hCG, Beta Chain, Quant, S: 172084 m[IU]/mL — ABNORMAL HIGH (ref <5)

## 2016-04-16 MED ORDER — FLUCONAZOLE 200 MG PO TABS: 200.0000 mg | ORAL_TABLET | Freq: Every day | ORAL | 0 refills | Status: DC

## 2016-04-16 MED ORDER — FLUCONAZOLE 200 MG PO TABS: 200.0000 mg | ORAL_TABLET | Freq: Every day | ORAL | 0 refills | Status: AC

## 2016-04-16 NOTE — Discharge Instructions (Signed)

## 2016-04-16 NOTE — MAU Provider Note (Signed)
History   G1 @ 10.5 wks in with right side abd pain for two days. Pain is dull and throbbing in nature, Denies nausea, vomiting diarrhea, or constipation.  CSN: [id number]  Arrival date & time 04/16/16  1509   First Provider Initiated Contact with Patient 04/16/16 1606      Chief Complaint  Patient presents with  . Abdominal Pain  . Vaginal Itching    HPI  Past Medical History:  Diagnosis Date  . Medical history non-contributory     Past Surgical History:  Procedure Laterality Date  . NO PAST SURGERIES      Family History  Problem Relation Age of Onset  . Heart disease Father     Social History  Substance Use Topics  . Smoking status: Never Smoker  . Smokeless tobacco: Not on file  . Alcohol use Yes     Comment: occ    OB History    Gravida Para Term Preterm AB Living   1 0 0 0 0 0   SAB TAB Ectopic Multiple Live Births   0 0 0 0        Review of Systems  Constitutional: Negative.   HENT: Negative.   Eyes: Negative.   Respiratory: Negative.   Cardiovascular: Negative.   Gastrointestinal: Positive for abdominal pain.  Endocrine: Negative.   Genitourinary: Negative.   Musculoskeletal: Negative.   Skin: Negative.     Allergies  Patient has no known allergies.  Home Medications    BP 125/81 (BP Location: Right Arm)   Pulse 74   Temp 98.5 F (36.9 C) (Oral)   Resp 16   Ht 5\' 5"  (1.651 m)   Wt 180 lb 6.4 oz (81.8 kg)   LMP 02/01/2016 (Approximate)   BMI 30.02 kg/m   Physical Exam  Constitutional: She is oriented to person, place, and time. She appears well-developed and well-nourished.  HENT:  Head: Normocephalic.  Eyes: Pupils are equal, round, and reactive to light.  Neck: Normal range of motion.  Cardiovascular: Normal rate, regular rhythm, normal heart sounds and intact distal pulses.   Pulmonary/Chest: Effort normal and breath sounds normal.  Abdominal: Soft. Bowel sounds are normal.  Genitourinary: Vagina normal and uterus normal.   Musculoskeletal: Normal range of motion.  Neurological: She is alert and oriented to person, place, and time. She has normal reflexes.  Skin: Skin is warm and dry.  Psychiatric: She has a normal mood and affect. Her behavior is normal. Judgment and thought content normal.    MAU Course  Procedures (including critical care time)  Labs Reviewed  WET PREP, GENITAL  URINALYSIS, ROUTINE W REFLEX MICROSCOPIC  HCG, QUANTITATIVE, PREGNANCY  GC/CHLAMYDIA PROBE AMP ([place]) NOT AT [facility]   No results found.   No diagnosis found.    MDM  FHR st and reg per doppler. abd soft and sl tender with palp. Wet prep yeast, GC and chla obtained. Cbc  WNL, quant WNL , US show viable IUP. Will treat for yeast and d/c home

## 2016-04-16 NOTE — MAU Note (Signed)
Keeps having pain in her rt side, like when she has a urinary tract infection or is constipation; started a couple days ago.  Then she has like an itch, doesn't know if she has a yeast infection or what. Is preg, confirmed at clinic 1/23, hasn't been seen yet, has appt 2/22

## 2016-04-18 LAB — GC/CHLAMYDIA PROBE AMP (~~LOC~~) NOT AT ARMC
Chlamydia: NEGATIVE
Neisseria Gonorrhea: NEGATIVE

## 2016-04-21 ENCOUNTER — Ambulatory Visit (INDEPENDENT_AMBULATORY_CARE_PROVIDER_SITE_OTHER): Payer: Medicaid Other | Admitting: Family Medicine

## 2016-04-21 ENCOUNTER — Other Ambulatory Visit (HOSPITAL_COMMUNITY)
Admission: RE | Admit: 2016-04-21 | Discharge: 2016-04-21 | Disposition: A | Discharge status: Home / Self Care | Payer: Medicaid Other | Source: Ambulatory Visit | Attending: Obstetrics & Gynecology | Admitting: Obstetrics & Gynecology

## 2016-04-21 ENCOUNTER — Other Ambulatory Visit: Payer: Self-pay | Admitting: Family Medicine

## 2016-04-21 DIAGNOSIS — Z3400 Encounter for supervision of normal first pregnancy, unspecified trimester: Secondary | ICD-10-CM

## 2016-04-21 DIAGNOSIS — Z01419 Encounter for gynecological examination (general) (routine) without abnormal findings: Secondary | ICD-10-CM | POA: Insufficient documentation

## 2016-04-21 DIAGNOSIS — Z3401 Encounter for supervision of normal first pregnancy, first trimester: Secondary | ICD-10-CM

## 2016-04-21 NOTE — Progress Notes (Signed)
  Subjective:    Kristen [name] is a G1P0000 6435w3d being seen today for her first obstetrical visit.  Her obstetrical history is insignificant. Patient does intend to breast feed. Pregnancy history fully reviewed.  Patient reports no complaints.  Vitals:   04/21/16 0918  BP: 125/73  Pulse: 85  Weight: 181 lb (82.1 kg)    HISTORY: OB History  Gravida Para Term Preterm AB Living  1 0 0 0 0 0  SAB TAB Ectopic Multiple Live Births  0 0 0 0      # Outcome Date GA Lbr Len/2nd Weight Sex Delivery Anes PTL Lv  1 Current              Past Medical History:  Diagnosis Date  . Medical history non-contributory    Past Surgical History:  Procedure Laterality Date  . NO PAST SURGERIES     Family History  Problem Relation Age of Onset  . Heart disease Father      Exam    Uterus:     Pelvic Exam:    Perineum: No Hemorrhoids, Normal Perineum   Vulva: normal, Bartholin's, Urethra, Skene's normal   Vagina:  normal mucosa, normal discharge   Cervix: no bleeding following Pap and nulliparous appearance   Adnexa: normal adnexa and no mass, fullness, tenderness   Bony Pelvis: gynecoid  System: Breast:  normal appearance, no masses or tenderness, Inspection negative, No nipple retraction or dimpling, No nipple discharge or bleeding, No axillary or supraclavicular adenopathy   Skin: normal coloration and turgor, no rashes    Neurologic: gait normal; reflexes normal and symmetric   Extremities: normal strength, tone, and muscle mass   HEENT PERRLA and extra ocular movement intact   Mouth/Teeth mucous membranes moist, pharynx normal without lesions   Neck supple and no masses   Cardiovascular: regular rate and rhythm, no murmurs or gallops   Respiratory:  appears well, vitals normal, no respiratory distress, acyanotic, normal RR, ear and throat exam is normal, neck free of mass or lymphadenopathy, chest clear, no wheezing, crepitations, rhonchi, normal symmetric air entry   Abdomen:  soft, non-tender; bowel sounds normal; no masses,  no organomegaly   Urinary: urethral meatus normal      Assessment:    Pregnancy: G1P0000 Patient Active Problem List   Diagnosis Date Noted  . Abdominal pain, acute, right lower quadrant 03/29/2012  . UTI (lower urinary tract infection) 03/29/2012        Plan:     Initial labs drawn. Prenatal vitamins. Problem list reviewed and updated. Genetic Screening discussed First Screen: ordered.  Ultrasound discussed; fetal survey: requested.  Follow up in 4 weeks. 100% of 30 min visit spent on counseling and coordination of care.     Levie HeritageJacob J Stinson 04/21/2016

## 2016-04-21 NOTE — Addendum Note (Signed)
Addended by: Anell BarrHOWARD, Damiya Sandefur L on: 04/21/2016 10:24 AM   Modules accepted: Orders

## 2016-04-25 LAB — CYTOLOGY - PAP
Adequacy: ABSENT
Diagnosis: NEGATIVE

## 2016-04-26 ENCOUNTER — Telehealth: Payer: Self-pay

## 2016-04-26 ENCOUNTER — Encounter: Payer: Self-pay | Admitting: Family Medicine

## 2016-04-26 DIAGNOSIS — D573 Sickle-cell trait: Secondary | ICD-10-CM | POA: Insufficient documentation

## 2016-04-26 LAB — OBSTETRIC PANEL, INCLUDING HIV
Antibody Screen: NEGATIVE
HIV Screen 4th Generation wRfx: NONREACTIVE
Hepatitis B Surface Ag: NEGATIVE
RPR Ser Ql: NONREACTIVE
Rh Factor: POSITIVE
Rubella Antibodies, IGG: 3.61 index (ref >0.99)

## 2016-04-26 LAB — CULTURE, URINE COMPREHENSIVE

## 2016-04-26 LAB — SICKLE CELL SCREEN: Sickle Cell Screen: POSITIVE — AB

## 2016-04-26 NOTE — Telephone Encounter (Signed)
Patient returned call to office and given result that she does have the sickle cell trait. Patient states that after speaking with her mother - her mother made her aware that she does have it.   Patient made aware that it is important for the FOB of the baby to get tested. Patient given information to the [facility] and informed he should go there for the testing. Patient states understanding. Armandina StammerJennifer Collin Rengel RNBSN

## 2016-04-26 NOTE — Telephone Encounter (Signed)
-----   Message from Levie HeritageJacob J Stinson, DO sent at 04/26/2016  9:28 AM EST ----- Sickle cell positive. [facility]

## 2016-04-26 NOTE — Telephone Encounter (Signed)
Left message for patient to return call to office regarding results.  [facility] [phone]  [address] [id number]

## 2016-04-28 ENCOUNTER — Telehealth: Payer: Self-pay

## 2016-04-28 ENCOUNTER — Other Ambulatory Visit: Payer: Self-pay | Admitting: Family Medicine

## 2016-04-28 MED ORDER — CEPHALEXIN 500 MG PO CAPS: 500.0000 mg | ORAL_CAPSULE | Freq: Four times a day (QID) | ORAL | 2 refills | Status: DC

## 2016-04-28 NOTE — Telephone Encounter (Signed)
Left message for patient to return call to office for results. Skeeter Sheard RNBSN 

## 2016-04-28 NOTE — Telephone Encounter (Signed)
-----   Message from Levie HeritageJacob J Stinson, DO sent at 04/28/2016  8:26 AM EST ----- Urine culture positive - keflex prescribed - 4x daily (with meals and bedtime) x 7 days. Please let pt know.

## 2016-05-02 ENCOUNTER — Encounter (HOSPITAL_COMMUNITY): Payer: Self-pay

## 2016-05-02 ENCOUNTER — Ambulatory Visit (HOSPITAL_COMMUNITY)
Admission: RE | Admit: 2016-05-02 | Discharge: 2016-05-02 | Disposition: A | Discharge status: Home / Self Care | Payer: Medicaid Other | Source: Ambulatory Visit | Attending: Family Medicine | Admitting: Family Medicine

## 2016-05-02 DIAGNOSIS — Z3682 Encounter for antenatal screening for nuchal translucency: Secondary | ICD-10-CM | POA: Diagnosis present

## 2016-05-02 DIAGNOSIS — Z3A13 13 weeks gestation of pregnancy: Secondary | ICD-10-CM | POA: Insufficient documentation

## 2016-05-02 DIAGNOSIS — Z3400 Encounter for supervision of normal first pregnancy, unspecified trimester: Secondary | ICD-10-CM

## 2016-05-19 ENCOUNTER — Ambulatory Visit (INDEPENDENT_AMBULATORY_CARE_PROVIDER_SITE_OTHER): Payer: Medicaid Other | Admitting: Family Medicine

## 2016-05-19 VITALS — BP 128/68 | HR 98 | Wt 181.0 lb

## 2016-05-19 DIAGNOSIS — Z3400 Encounter for supervision of normal first pregnancy, unspecified trimester: Secondary | ICD-10-CM

## 2016-05-19 DIAGNOSIS — Z3402 Encounter for supervision of normal first pregnancy, second trimester: Secondary | ICD-10-CM

## 2016-05-19 NOTE — Progress Notes (Signed)
   PRENATAL VISIT NOTE  Subjective:  Kristen RoesMarkisa [name] is a 26 y.o. G1P0000 at 3422w3d being seen today for ongoing prenatal care.  She is currently monitored for the following issues for this low-risk pregnancy and has Abdominal pain, acute, right lower quadrant; UTI (lower urinary tract infection); Supervision of normal first pregnancy, antepartum; and Sickle cell trait (HCC) on her problem list.  Patient reports no complaints.   . Vag. Bleeding: None.   . Denies leaking of fluid.   The following portions of the patient's history were reviewed and updated as appropriate: allergies, current medications, past family history, past medical history, past social history, past surgical history and problem list. Problem list updated.  Objective:   Vitals:   05/19/16 0956  BP: 128/68  Pulse: 98  Weight: 181 lb (82.1 kg)    Fetal Status: Fetal Heart Rate (bpm): 160         General:  Alert, oriented and cooperative. Patient is in no acute distress.  Skin: Skin is warm and dry. No rash noted.   Cardiovascular: Normal heart rate noted  Respiratory: Normal respiratory effort, no problems with respiration noted  Abdomen: Soft, gravid, appropriate for gestational age. Pain/Pressure: Absent     Pelvic:  Cervical exam deferred        Extremities: Normal range of motion.  Edema: None  Mental Status: Normal mood and affect. Normal behavior. Normal judgment and thought content.   Assessment and Plan:  Pregnancy: G1P0000 at 7622w3d  1. Supervision of normal first pregnancy, antepartum FHT and FH normal. No questions. - US MFM OB COMP + 14 WK; Future  Preterm labor symptoms and general obstetric precautions including but not limited to vaginal bleeding, contractions, leaking of fluid and fetal movement were reviewed in detail with the patient. Please refer to After Visit Summary for other counseling recommendations.  No Follow-up on file.   Levie HeritageJacob J Stinson, DO

## 2016-05-19 NOTE — Progress Notes (Signed)
Made patient aware that she had a urinary tract infection and made her aware that she has an rx at the pharmacy to pick up. Armandina StammerJennifer Howard RNBSN

## 2016-06-14 ENCOUNTER — Other Ambulatory Visit: Payer: Self-pay | Admitting: Family Medicine

## 2016-06-14 ENCOUNTER — Ambulatory Visit (HOSPITAL_COMMUNITY)
Admission: RE | Admit: 2016-06-14 | Discharge: 2016-06-14 | Disposition: A | Discharge status: Home / Self Care | Payer: Medicaid Other | Source: Ambulatory Visit | Attending: Family Medicine | Admitting: Family Medicine

## 2016-06-14 DIAGNOSIS — Z3400 Encounter for supervision of normal first pregnancy, unspecified trimester: Secondary | ICD-10-CM

## 2016-06-14 DIAGNOSIS — Z363 Encounter for antenatal screening for malformations: Secondary | ICD-10-CM | POA: Diagnosis present

## 2016-06-14 DIAGNOSIS — Z3689 Encounter for other specified antenatal screening: Secondary | ICD-10-CM

## 2016-06-14 DIAGNOSIS — Z3A19 19 weeks gestation of pregnancy: Secondary | ICD-10-CM | POA: Insufficient documentation

## 2016-06-16 ENCOUNTER — Ambulatory Visit (INDEPENDENT_AMBULATORY_CARE_PROVIDER_SITE_OTHER): Payer: Medicaid Other | Admitting: Family Medicine

## 2016-06-16 DIAGNOSIS — O99012 Anemia complicating pregnancy, second trimester: Secondary | ICD-10-CM

## 2016-06-16 DIAGNOSIS — D573 Sickle-cell trait: Secondary | ICD-10-CM

## 2016-06-16 DIAGNOSIS — Z3400 Encounter for supervision of normal first pregnancy, unspecified trimester: Secondary | ICD-10-CM

## 2016-06-16 NOTE — Progress Notes (Signed)
   PRENATAL VISIT NOTE  Subjective:  Kristen [name] is a 26 y.o. G1P0000 at [redacted]w[redacted]d being seen today for ongoing prenatal care.  She is currently monitored for the following issues for this low-risk pregnancy and has Abdominal pain, acute, right lower quadrant; UTI (lower urinary tract infection); Supervision of normal first pregnancy, antepartum; and Sickle cell trait (HCC) on her problem list.  Patient reports no complaints.  Contractions: Not present. Vag. Bleeding: None.  Movement: Present. Denies leaking of fluid.   The following portions of the patient's history were reviewed and updated as appropriate: allergies, current medications, past family history, past medical history, past social history, past surgical history and problem list. Problem list updated.  Objective:   Vitals:   06/16/16 0958  BP: 117/75  Pulse: 80  Weight: 180 lb (81.6 kg)    Fetal Status: Fetal Heart Rate (bpm): 147   Movement: Present     General:  Alert, oriented and cooperative. Patient is in no acute distress.  Skin: Skin is warm and dry. No rash noted.   Cardiovascular: Normal heart rate noted  Respiratory: Normal respiratory effort, no problems with respiration noted  Abdomen: Soft, gravid, appropriate for gestational age. Pain/Pressure: Absent     Pelvic:  Cervical exam deferred        Extremities: Normal range of motion.  Edema: None  Mental Status: Normal mood and affect. Normal behavior. Normal judgment and thought content.   Assessment and Plan:  Pregnancy: G1P0000 at [redacted]w[redacted]d  1. Supervision of normal first pregnancy, antepartum FHT and FH normal. Quad screen today  2. Sickle cell trait (HCC)   Preterm labor symptoms and general obstetric precautions including but not limited to vaginal bleeding, contractions, leaking of fluid and fetal movement were reviewed in detail with the patient. Please refer to After Visit Summary for other counseling recommendations.  Return in about 4 weeks (around  07/14/2016) for OB f/u.   Levie Heritage, DO

## 2016-06-19 LAB — AFP, QUAD SCREEN
DIA Mom Value: 1.5
DIA Value (EIA): 246.18 pg/mL
DSR (By Age)    1 IN: 1067
DSR (Second Trimester) 1 IN: 2428
Gestational Age: 19.4 WEEKS
MSAFP Mom: 0.79
MSAFP: 40.1 ng/mL
MSHCG Mom: 0.85
MSHCG: 18351 m[IU]/mL
Maternal Age At EDD: 24.1 yr
Osb Risk: 10000
T18 (By Age): 1:4157 {titer}
Test Results:: NEGATIVE
Weight: 180 [lb_av]
uE3 Mom: 1.18
uE3 Value: 1.84 ng/mL

## 2016-06-29 ENCOUNTER — Encounter: Payer: Self-pay | Admitting: Family Medicine

## 2016-06-29 ENCOUNTER — Telehealth: Payer: Self-pay

## 2016-06-29 MED ORDER — FLUCONAZOLE 150 MG PO TABS: 150.0000 mg | ORAL_TABLET | Freq: Once | ORAL | 0 refills | Status: AC

## 2016-06-29 NOTE — Telephone Encounter (Signed)
Patient reporting symptoms for 2 days of vaginal itching with white cottage cheese like discharge. Also report vaginal burning. Armandina Stammer RN BSN

## 2016-06-30 ENCOUNTER — Other Ambulatory Visit: Payer: Self-pay | Admitting: Family Medicine

## 2016-06-30 MED ORDER — TERCONAZOLE 0.8 % VA CREA: 1.0000 | TOPICAL_CREAM | Freq: Every day | VAGINAL | 0 refills | Status: DC

## 2016-06-30 NOTE — Progress Notes (Signed)
myChart message from pt, requesting treatment for yeast infection.--rx sent in.

## 2016-07-01 ENCOUNTER — Inpatient Hospital Stay (HOSPITAL_COMMUNITY)
Admission: AD | Admit: 2016-07-01 | Discharge: 2016-07-01 | Disposition: A | Discharge status: Home / Self Care | Payer: Medicaid Other | Source: Ambulatory Visit | Attending: Obstetrics and Gynecology | Admitting: Obstetrics and Gynecology

## 2016-07-01 ENCOUNTER — Encounter (HOSPITAL_COMMUNITY): Payer: Self-pay | Admitting: *Deleted

## 2016-07-01 DIAGNOSIS — O9989 Other specified diseases and conditions complicating pregnancy, childbirth and the puerperium: Secondary | ICD-10-CM

## 2016-07-01 DIAGNOSIS — B373 Candidiasis of vulva and vagina: Secondary | ICD-10-CM | POA: Diagnosis not present

## 2016-07-01 DIAGNOSIS — Z8249 Family history of ischemic heart disease and other diseases of the circulatory system: Secondary | ICD-10-CM | POA: Insufficient documentation

## 2016-07-01 DIAGNOSIS — Z79899 Other long term (current) drug therapy: Secondary | ICD-10-CM | POA: Diagnosis not present

## 2016-07-01 DIAGNOSIS — R21 Rash and other nonspecific skin eruption: Secondary | ICD-10-CM | POA: Insufficient documentation

## 2016-07-01 DIAGNOSIS — O98812 Other maternal infectious and parasitic diseases complicating pregnancy, second trimester: Secondary | ICD-10-CM | POA: Diagnosis present

## 2016-07-01 DIAGNOSIS — B9689 Other specified bacterial agents as the cause of diseases classified elsewhere: Secondary | ICD-10-CM | POA: Diagnosis not present

## 2016-07-01 DIAGNOSIS — Z3A21 21 weeks gestation of pregnancy: Secondary | ICD-10-CM | POA: Insufficient documentation

## 2016-07-01 DIAGNOSIS — B3731 Acute candidiasis of vulva and vagina: Secondary | ICD-10-CM

## 2016-07-01 DIAGNOSIS — N760 Acute vaginitis: Secondary | ICD-10-CM

## 2016-07-01 HISTORY — DX: Other specified bacterial agents as the cause of diseases classified elsewhere: N76.0

## 2016-07-01 HISTORY — DX: Other specified bacterial agents as the cause of diseases classified elsewhere: B96.89

## 2016-07-01 HISTORY — DX: Acute vaginitis: N76.0

## 2016-07-01 LAB — URINALYSIS, ROUTINE W REFLEX MICROSCOPIC
Bilirubin Urine: NEGATIVE
Glucose, UA: NEGATIVE mg/dL
Ketones, ur: NEGATIVE mg/dL
Nitrite: NEGATIVE
Protein, ur: NEGATIVE mg/dL
Specific Gravity, Urine: 1.009 (ref 1.005–1.030)
pH: 7 (ref 5.0–8.0)

## 2016-07-01 LAB — WET PREP, GENITAL
Sperm: NONE SEEN
Trich, Wet Prep: NONE SEEN

## 2016-07-01 MED ORDER — METRONIDAZOLE 500 MG PO TABS: 500.0000 mg | ORAL_TABLET | Freq: Two times a day (BID) | ORAL | 0 refills | Status: AC

## 2016-07-01 MED ORDER — FLUCONAZOLE 150 MG PO TABS
150.0000 mg | ORAL_TABLET | Freq: Once | ORAL | 0 refills | Status: AC
Start: 2016-07-01 — End: 2016-07-01

## 2016-07-01 NOTE — MAU Note (Signed)
Pt presents to MAU with complaints of a yellow vaginal discharge with an itch. Pt states she was given diflucan yesterday but is concerned since the discharge was white but now it is yellow. Denies any pain

## 2016-07-01 NOTE — MAU Provider Note (Signed)
History     CSN: [id number]  Arrival date and time: 07/01/16 1209   First Provider Initiated Contact with Patient 07/01/16 1335      Chief Complaint  Patient presents with  . Vaginal Discharge   Ms. Chantavia Bazzle is a 26 yo G1P0 at 21.[redacted] wks gestation presenting with complaints of yellow vaginal discharge.  She reports she had thick, white discharge like yeast infection 2 days ago.  She reports that she was seen in [place] at her OB visit and was prescribed Terazol cream and Diflucan.  She took Diflucan yesterday and now notices her discharge has changed to a yellow color. She reports labial swelling and rash after using the Terazol cream once; she has not used it since. She has not had recent SI, but not aware if partner has had any exposure to STIs.  Vaginal Discharge  The patient's primary symptoms include vaginal discharge (yellow). The patient's pertinent negatives include no pelvic pain. This is a new problem. The current episode started in the past 7 days. The problem occurs constantly. The problem has been unchanged. The patient is experiencing no pain. She is pregnant. The vaginal discharge was thick, white and yellow. There has been no bleeding. She has not been passing clots. She has not been passing tissue. Nothing aggravates the symptoms. She has tried antifungals (Terazol cream caused rash and swelling on labia) for the symptoms.     Past Medical History:  Diagnosis Date  . Medical history non-contributory     Past Surgical History:  Procedure Laterality Date  . NO PAST SURGERIES      Family History  Problem Relation Age of Onset  . Heart disease Father     Social History  Substance Use Topics  . Smoking status: Never Smoker  . Smokeless tobacco: Never Used  . Alcohol use Yes     Comment: occ    Allergies: No Known Allergies  Prescriptions Prior to Admission  Medication Sig Dispense Refill Last Dose  . cephALEXin (KEFLEX) 500 MG capsule Take 1 capsule (500 mg  total) by mouth 4 (four) times daily. (Patient not taking: Reported on 05/02/2016) 28 capsule 2 Not Taking  . Prenatal Vit-Fe Fumarate-FA (MULTIVITAMIN-PRENATAL) 27-0.8 MG TABS tablet Take 1 tablet by mouth daily at 12 noon.   Taking  . terconazole (TERAZOL 3) 0.8 % vaginal cream Place 1 applicator vaginally at bedtime. 20 g 0     Review of Systems  Constitutional: Negative.   HENT: Negative.   Eyes: Negative.   Respiratory: Negative.   Cardiovascular: Negative.   Gastrointestinal: Negative.   Endocrine: Negative.   Genitourinary: Positive for vaginal discharge (yellow). Negative for pelvic pain.  Musculoskeletal: Negative.   Skin: Negative.   Allergic/Immunologic: Negative.   Neurological: Negative.   Hematological: Negative.   Psychiatric/Behavioral: Negative.    Results for orders placed or performed during the hospital encounter of 07/01/16 (from the past 24 hour(s))  Urinalysis, Routine w reflex microscopic     Status: Abnormal   Collection Time: 07/01/16 12:33 PM  Result Value Ref Range   Color, Urine YELLOW YELLOW   APPearance CLOUDY (A) CLEAR   Specific Gravity, Urine 1.009 1.005 - 1.030   pH 7.0 5.0 - 8.0   Glucose, UA NEGATIVE NEGATIVE mg/dL   Hgb urine dipstick SMALL (A) NEGATIVE   Bilirubin Urine NEGATIVE NEGATIVE   Ketones, ur NEGATIVE NEGATIVE mg/dL   Protein, ur NEGATIVE NEGATIVE mg/dL   Nitrite NEGATIVE NEGATIVE   Leukocytes, UA LARGE (A)  NEGATIVE   RBC / HPF 6-30 0 - 5 RBC/hpf   WBC, UA 6-30 0 - 5 WBC/hpf   Bacteria, UA FEW (A) NONE SEEN   Squamous Epithelial / LPF 6-30 (A) NONE SEEN   Mucous PRESENT   Wet prep, genital     Status: Abnormal   Collection Time: 07/01/16  1:50 PM  Result Value Ref Range   Yeast Wet Prep HPF POC PRESENT (A) NONE SEEN   Trich, Wet Prep NONE SEEN NONE SEEN   Clue Cells Wet Prep HPF POC PRESENT (A) NONE SEEN   WBC, Wet Prep HPF POC MANY (A) NONE SEEN   Sperm NONE SEEN    Physical Exam   Blood pressure 124/68, pulse 92,  temperature 98.8 F (37.1 C), resp. rate 18, height 5\' 4"  (1.626 m), weight 82.6 kg (182 lb), last menstrual period 02/01/2016.  Physical Exam  Constitutional: She is oriented to person, place, and time. She appears well-developed and well-nourished.  HENT:  Head: Normocephalic.  Eyes: Pupils are equal, round, and reactive to light.  Neck: Normal range of motion.  Cardiovascular: Normal rate, regular rhythm, normal heart sounds and intact distal pulses.   Respiratory: Effort normal and breath sounds normal.  GI: Soft. Bowel sounds are normal.  Genitourinary:  Genitourinary Comments: Gravid, S=D, moderate amount of thin yellow d/c in vagina, Cx: closed/long/firm/posterior, non-friable.  Musculoskeletal: Normal range of motion.  Neurological: She is alert and oriented to person, place, and time. She has normal reflexes.  Skin: Skin is warm and dry.  Psychiatric: She has a normal mood and affect. Her behavior is normal. Judgment and thought content normal.    MAU Course  Procedures  MDM CCUA Wet prep - (+) BV (+) yeast / will tx for both / pt unable to tolerate Terazol *Pt left before results d/t work schedule GC/CT Assessment and Plan  26 yo G1P0 at 21.[redacted] wks gestation Vaginal Discharge Candida Vaginitis Bacterial Vaginitis  Discharge home TC to pt to notify of lab results and Rx's being sent to pharmacy Rx for Diflucan 150 mg po x 1 dose Rx Flagyl 500 mg BID x 7 days  Patient verbalized an understanding of the plan of care and agrees.  Raelyn Moraolitta Kyreese Chio MSN, CNM 07/01/2016, 1:53 PM

## 2016-07-04 LAB — GC/CHLAMYDIA PROBE AMP (~~LOC~~) NOT AT ARMC
Chlamydia: NEGATIVE
Neisseria Gonorrhea: NEGATIVE

## 2016-07-15 ENCOUNTER — Encounter: Payer: Medicaid Other | Admitting: Family Medicine

## 2016-07-21 ENCOUNTER — Ambulatory Visit (INDEPENDENT_AMBULATORY_CARE_PROVIDER_SITE_OTHER): Payer: Medicaid Other | Admitting: Family Medicine

## 2016-07-21 VITALS — BP 123/71 | HR 82 | Wt 182.0 lb

## 2016-07-21 DIAGNOSIS — Z3400 Encounter for supervision of normal first pregnancy, unspecified trimester: Secondary | ICD-10-CM

## 2016-07-21 DIAGNOSIS — O26892 Other specified pregnancy related conditions, second trimester: Secondary | ICD-10-CM

## 2016-07-21 DIAGNOSIS — D573 Sickle-cell trait: Secondary | ICD-10-CM

## 2016-07-21 DIAGNOSIS — O99891 Other specified diseases and conditions complicating pregnancy: Secondary | ICD-10-CM

## 2016-07-21 DIAGNOSIS — O9989 Other specified diseases and conditions complicating pregnancy, childbirth and the puerperium: Secondary | ICD-10-CM

## 2016-07-21 DIAGNOSIS — O99012 Anemia complicating pregnancy, second trimester: Secondary | ICD-10-CM

## 2016-07-21 DIAGNOSIS — M549 Dorsalgia, unspecified: Secondary | ICD-10-CM

## 2016-07-21 NOTE — Progress Notes (Signed)
Explained to patient she will need to be fasting at her next visit for her 2 hour glucose test. Armandina StammerJennifer Bynum Mccullars RNBSN

## 2016-07-21 NOTE — Progress Notes (Signed)
   PRENATAL VISIT NOTE  Subjective:  Kristen RoesMarkisa Fellows is a 26 y.o. G1P0000 at 855w3d being seen today for ongoing prenatal care.  She is currently monitored for the following issues for this low-risk pregnancy and has Abdominal pain, acute, right lower quadrant; UTI (lower urinary tract infection); Supervision of normal first pregnancy, antepartum; Sickle cell trait (HCC); Candida vaginitis; and Bacterial vaginitis on her problem list.  Patient reports backache. On right lumbosacral area. Worse when lays down at night Contractions: Not present. Vag. Bleeding: None.  Movement: Present. Denies leaking of fluid.   The following portions of the patient's history were reviewed and updated as appropriate: allergies, current medications, past family history, past medical history, past social history, past surgical history and problem list. Problem list updated.  Objective:   Vitals:   07/21/16 1001  BP: 123/71  Pulse: 82  Weight: 182 lb (82.6 kg)    Fetal Status: Fetal Heart Rate (bpm): 140   Movement: Present     General:  Alert, oriented and cooperative. Patient is in no acute distress.  Skin: Skin is warm and dry. No rash noted.   Cardiovascular: Normal heart rate noted  Respiratory: Normal respiratory effort, no problems with respiration noted  Abdomen: Soft, gravid, appropriate for gestational age. Pain/Pressure: Absent     Pelvic:  Cervical exam deferred        Extremities: Normal range of motion.  Edema: None  Mental Status: Normal mood and affect. Normal behavior. Normal judgment and thought content.   Assessment and Plan:  Pregnancy: G1P0000 at 8955w3d  1. Supervision of normal first pregnancy, antepartum FHT and FH normal. Fasting 2hr GTT next appt.  2. Sickle cell trait (HCC)  3. Back pain affecting pregnancy in second trimester Offered OMT - pt declined at present. Recommended body pillow, pillow between legs, abdominal support band.  Preterm labor symptoms and general  obstetric precautions including but not limited to vaginal bleeding, contractions, leaking of fluid and fetal movement were reviewed in detail with the patient. Please refer to After Visit Summary for other counseling recommendations.  No Follow-up on file.   Levie HeritageJacob J Marisal Swarey, DO

## 2016-08-18 ENCOUNTER — Ambulatory Visit (INDEPENDENT_AMBULATORY_CARE_PROVIDER_SITE_OTHER): Payer: Medicaid Other | Admitting: Family Medicine

## 2016-08-18 VITALS — BP 115/66 | HR 91 | Wt 187.0 lb

## 2016-08-18 DIAGNOSIS — Z23 Encounter for immunization: Secondary | ICD-10-CM | POA: Diagnosis not present

## 2016-08-18 DIAGNOSIS — Z3400 Encounter for supervision of normal first pregnancy, unspecified trimester: Secondary | ICD-10-CM

## 2016-08-18 DIAGNOSIS — Z3492 Encounter for supervision of normal pregnancy, unspecified, second trimester: Secondary | ICD-10-CM

## 2016-08-18 NOTE — Progress Notes (Signed)
   PRENATAL VISIT NOTE  Subjective:  Kristen [name] is a 26 y.o. G1P0000 at 6966w3d being seen today for ongoing prenatal care.  She is currently monitored for the following issues for this low-risk pregnancy and has Abdominal pain, acute, right lower quadrant; UTI (lower urinary tract infection); Supervision of normal first pregnancy, antepartum; and Sickle cell trait (HCC) on her problem list.  Patient reports no complaints.  Contractions: Not present. Vag. Bleeding: None.  Movement: Present. Denies leaking of fluid.   The following portions of the patient's history were reviewed and updated as appropriate: allergies, current medications, past family history, past medical history, past social history, past surgical history and problem list. Problem list updated.  Objective:   Vitals:   08/18/16 0827  BP: 115/66  Pulse: 91  Weight: 187 lb (84.8 kg)    Fetal Status: Fetal Heart Rate (bpm): 143 Fundal Height: 29 cm Movement: Present     General:  Alert, oriented and cooperative. Patient is in no acute distress.  Skin: Skin is warm and dry. No rash noted.   Cardiovascular: Normal heart rate noted  Respiratory: Normal respiratory effort, no problems with respiration noted  Abdomen: Soft, gravid, appropriate for gestational age. Pain/Pressure: Absent     Pelvic:  Cervical exam deferred        Extremities: Normal range of motion.  Edema: None  Mental Status: Normal mood and affect. Normal behavior. Normal judgment and thought content.   Assessment and Plan:  Pregnancy: G1P0000 at 3366w3d  1. Prenatal care in second trimester Continue routine prenatal care. 28 wk labs today TDaP given - Glucose Tolerance, 2 Hours w/1 Hour - HIV antibody (with reflex) - RPR - CBC   Preterm labor symptoms and general obstetric precautions including but not limited to vaginal bleeding, contractions, leaking of fluid and fetal movement were reviewed in detail with the patient. Please refer to After Visit  Summary for other counseling recommendations.  Return in 2 weeks (on 09/01/2016).   Reva Boresanya S Chloee Tena, MD

## 2016-08-18 NOTE — Patient Instructions (Signed)
Contraception Choices Contraception (birth control) is the use of any methods or devices to prevent pregnancy. Below are some methods to help avoid pregnancy. Hormonal methods  Contraceptive implant. This is a thin, plastic tube containing progesterone hormone. It does not contain estrogen hormone. Your health care provider inserts the tube in the inner part of the upper arm. The tube can remain in place for up to 3 years. After 3 years, the implant must be removed. The implant prevents the ovaries from releasing an egg (ovulation), thickens the cervical mucus to prevent sperm from entering the uterus, and thins the lining of the inside of the uterus.  Progesterone-only injections. These injections are given every 3 months by your health care provider to prevent pregnancy. This synthetic progesterone hormone stops the ovaries from releasing eggs. It also thickens cervical mucus and changes the uterine lining. This makes it harder for sperm to survive in the uterus.  Birth control pills. These pills contain estrogen and progesterone hormone. They work by preventing the ovaries from releasing eggs (ovulation). They also cause the cervical mucus to thicken, preventing the sperm from entering the uterus. Birth control pills are prescribed by a health care provider.Birth control pills can also be used to treat heavy periods.  Minipill. This type of birth control pill contains only the progesterone hormone. They are taken every day of each month and must be prescribed by your health care provider.  Birth control patch. The patch contains hormones similar to those in birth control pills. It must be changed once a week and is prescribed by a health care provider.  Vaginal ring. The ring contains hormones similar to those in birth control pills. It is left in the vagina for 3 weeks, removed for 1 week, and then a new one is put back in place. The patient must be comfortable inserting and removing the ring  from the vagina.A health care provider's prescription is necessary.  Emergency contraception. Emergency contraceptives prevent pregnancy after unprotected sexual intercourse. This pill can be taken right after sex or up to 5 days after unprotected sex. It is most effective the sooner you take the pills after having sexual intercourse. Most emergency contraceptive pills are available without a prescription. Check with your pharmacist. Do not use emergency contraception as your only form of birth control. Barrier methods  Female condom. This is a thin sheath (latex or rubber) that is worn over the penis during sexual intercourse. It can be used with spermicide to increase effectiveness.  Female condom. This is a soft, loose-fitting sheath that is put into the vagina before sexual intercourse.  Diaphragm. This is a soft, latex, dome-shaped barrier that must be fitted by a health care provider. It is inserted into the vagina, along with a spermicidal jelly. It is inserted before intercourse. The diaphragm should be left in the vagina for 6 to 8 hours after intercourse.  Cervical cap. This is a round, soft, latex or plastic cup that fits over the cervix and must be fitted by a health care provider. The cap can be left in place for up to 48 hours after intercourse.  Sponge. This is a soft, circular piece of polyurethane foam. The sponge has spermicide in it. It is inserted into the vagina after wetting it and before sexual intercourse.  Spermicides. These are chemicals that kill or block sperm from entering the cervix and uterus. They come in the form of creams, jellies, suppositories, foam, or tablets. They do not require a prescription. They   are inserted into the vagina with an applicator before having sexual intercourse. The process must be repeated every time you have sexual intercourse. Intrauterine contraception  Intrauterine device (IUD). This is a T-shaped device that is put in a woman's uterus  during a menstrual period to prevent pregnancy. There are 2 types: ? Copper IUD. This type of IUD is wrapped in copper wire and is placed inside the uterus. Copper makes the uterus and fallopian tubes produce a fluid that kills sperm. It can stay in place for 10 years. ? Hormone IUD. This type of IUD contains the hormone progestin (synthetic progesterone). The hormone thickens the cervical mucus and prevents sperm from entering the uterus, and it also thins the uterine lining to prevent implantation of a fertilized egg. The hormone can weaken or kill the sperm that get into the uterus. It can stay in place for 3-5 years, depending on which type of IUD is used. Permanent methods of contraception  Female tubal ligation. This is when the woman's fallopian tubes are surgically sealed, tied, or blocked to prevent the egg from traveling to the uterus.  Hysteroscopic sterilization. This involves placing a small coil or insert into each fallopian tube. Your doctor uses a technique called hysteroscopy to do the procedure. The device causes scar tissue to form. This results in permanent blockage of the fallopian tubes, so the sperm cannot fertilize the egg. It takes about 3 months after the procedure for the tubes to become blocked. You must use another form of birth control for these 3 months.  Female sterilization. This is when the female has the tubes that carry sperm tied off (vasectomy).This blocks sperm from entering the vagina during sexual intercourse. After the procedure, the man can still ejaculate fluid (semen). Natural planning methods  Natural family planning. This is not having sexual intercourse or using a barrier method (condom, diaphragm, cervical cap) on days the woman could become pregnant.  Calendar method. This is keeping track of the length of each menstrual cycle and identifying when you are fertile.  Ovulation method. This is avoiding sexual intercourse during ovulation.  Symptothermal  method. This is avoiding sexual intercourse during ovulation, using a thermometer and ovulation symptoms.  Post-ovulation method. This is timing sexual intercourse after you have ovulated. Regardless of which type or method of contraception you choose, it is important that you use condoms to protect against the transmission of sexually transmitted infections (STIs). Talk with your health care provider about which form of contraception is most appropriate for you. This information is not intended to replace advice given to you by your health care provider. Make sure you discuss any questions you have with your health care provider. Document Released: 02/14/2005 Document Revised: 07/23/2015 Document Reviewed: 08/09/2012 Elsevier Interactive Patient Education  2017 [employer]   Breastfeeding Deciding to breastfeed is one of the best choices you can make for you and your baby. A change in hormones during pregnancy causes your breast tissue to grow and increases the number and size of your milk ducts. These hormones also allow proteins, sugars, and fats from your blood supply to make breast milk in your milk-producing glands. Hormones prevent breast milk from being released before your baby is born as well as prompt milk flow after birth. Once breastfeeding has begun, thoughts of your baby, as well as his or her sucking or crying, can stimulate the release of milk from your milk-producing glands. Benefits of breastfeeding For Your Baby  Your first milk (colostrum) helps   your baby's digestive system function better.  There are antibodies in your milk that help your baby fight off infections.  Your baby has a lower incidence of asthma, allergies, and sudden infant death syndrome.  The nutrients in breast milk are better for your baby than infant formulas and are designed uniquely for your baby's needs.  Breast milk improves your baby's brain development.  Your baby is less likely to develop  other conditions, such as childhood obesity, asthma, or type 2 diabetes mellitus.  For You  Breastfeeding helps to create a very special bond between you and your baby.  Breastfeeding is convenient. Breast milk is always available at the correct temperature and costs nothing.  Breastfeeding helps to burn calories and helps you lose the weight gained during pregnancy.  Breastfeeding makes your uterus contract to its prepregnancy size faster and slows bleeding (lochia) after you give birth.  Breastfeeding helps to lower your risk of developing type 2 diabetes mellitus, osteoporosis, and breast or ovarian cancer later in life.  Signs that your baby is hungry Early Signs of Hunger  Increased alertness or activity.  Stretching.  Movement of the head from side to side.  Movement of the head and opening of the mouth when the corner of the mouth or cheek is stroked (rooting).  Increased sucking sounds, smacking lips, cooing, sighing, or squeaking.  Hand-to-mouth movements.  Increased sucking of fingers or hands.  Late Signs of Hunger  Fussing.  Intermittent crying.  Extreme Signs of Hunger Signs of extreme hunger will require calming and consoling before your baby will be able to breastfeed successfully. Do not wait for the following signs of extreme hunger to occur before you initiate breastfeeding:  Restlessness.  A loud, strong cry.  Screaming.  Breastfeeding basics Breastfeeding Initiation  Find a comfortable place to sit or lie down, with your neck and back well supported.  Place a pillow or rolled up blanket under your baby to bring him or her to the level of your breast (if you are seated). Nursing pillows are specially designed to help support your arms and your baby while you breastfeed.  Make sure that your baby's abdomen is facing your abdomen.  Gently massage your breast. With your fingertips, massage from your chest wall toward your nipple in a circular  motion. This encourages milk flow. You may need to continue this action during the feeding if your milk flows slowly.  Support your breast with 4 fingers underneath and your thumb above your nipple. Make sure your fingers are well away from your nipple and your baby's mouth.  Stroke your baby's lips gently with your finger or nipple.  When your baby's mouth is open wide enough, quickly bring your baby to your breast, placing your entire nipple and as much of the colored area around your nipple (areola) as possible into your baby's mouth. ? More areola should be visible above your baby's upper lip than below the lower lip. ? Your baby's tongue should be between his or her lower gum and your breast.  Ensure that your baby's mouth is correctly positioned around your nipple (latched). Your baby's lips should create a seal on your breast and be turned out (everted).  It is common for your baby to suck about 2-3 minutes in order to start the flow of breast milk.  Latching Teaching your baby how to latch on to your breast properly is very important. An improper latch can cause nipple pain and decreased milk supply for   you and poor weight gain in your baby. Also, if your baby is not latched onto your nipple properly, he or she may swallow some air during feeding. This can make your baby fussy. Burping your baby when you switch breasts during the feeding can help to get rid of the air. However, teaching your baby to latch on properly is still the best way to prevent fussiness from swallowing air while breastfeeding. Signs that your baby has successfully latched on to your nipple:  Silent tugging or silent sucking, without causing you pain.  Swallowing heard between every 3-4 sucks.  Muscle movement above and in front of his or her ears while sucking.  Signs that your baby has not successfully latched on to nipple:  Sucking sounds or smacking sounds from your baby while breastfeeding.  Nipple  pain.  If you think your baby has not latched on correctly, slip your finger into the corner of your baby's mouth to break the suction and place it between your baby's gums. Attempt breastfeeding initiation again. Signs of Successful Breastfeeding Signs from your baby:  A gradual decrease in the number of sucks or complete cessation of sucking.  Falling asleep.  Relaxation of his or her body.  Retention of a small amount of milk in his or her mouth.  Letting go of your breast by himself or herself.  Signs from you:  Breasts that have increased in firmness, weight, and size 1-3 hours after feeding.  Breasts that are softer immediately after breastfeeding.  Increased milk volume, as well as a change in milk consistency and color by the fifth day of breastfeeding.  Nipples that are not sore, cracked, or bleeding.  Signs That Your Baby is Getting Enough Milk  Wetting at least 1-2 diapers during the first 24 hours after birth.  Wetting at least 5-6 diapers every 24 hours for the first week after birth. The urine should be clear or pale yellow by 5 days after birth.  Wetting 6-8 diapers every 24 hours as your baby continues to grow and develop.  At least 3 stools in a 24-hour period by age 5 days. The stool should be soft and yellow.  At least 3 stools in a 24-hour period by age 7 days. The stool should be seedy and yellow.  No loss of weight greater than 10% of birth weight during the first 3 days of age.  Average weight gain of 4-7 ounces (113-198 g) per week after age 4 days.  Consistent daily weight gain by age 5 days, without weight loss after the age of 2 weeks.  After a feeding, your baby may spit up a small amount. This is common. Breastfeeding frequency and duration Frequent feeding will help you make more milk and can prevent sore nipples and breast engorgement. Breastfeed when you feel the need to reduce the fullness of your breasts or when your baby shows signs of  hunger. This is called "breastfeeding on demand." Avoid introducing a pacifier to your baby while you are working to establish breastfeeding (the first 4-6 weeks after your baby is born). After this time you may choose to use a pacifier. Research has shown that pacifier use during the first year of a baby's life decreases the risk of sudden infant death syndrome (SIDS). Allow your baby to feed on each breast as long as he or she wants. Breastfeed until your baby is finished feeding. When your baby unlatches or falls asleep while feeding from the first breast, offer the   second breast. Because newborns are often sleepy in the first few weeks of life, you may need to awaken your baby to get him or her to feed. Breastfeeding times will vary from baby to baby. However, the following rules can serve as a guide to help you ensure that your baby is properly fed:  Newborns (babies 4 weeks of age or younger) may breastfeed every 1-3 hours.  Newborns should not go longer than 3 hours during the day or 5 hours during the night without breastfeeding.  You should breastfeed your baby a minimum of 8 times in a 24-hour period until you begin to introduce solid foods to your baby at around 6 months of age.  Breast milk pumping Pumping and storing breast milk allows you to ensure that your baby is exclusively fed your breast milk, even at times when you are unable to breastfeed. This is especially important if you are going back to work while you are still breastfeeding or when you are not able to be present during feedings. Your lactation consultant can give you guidelines on how long it is safe to store breast milk. A breast pump is a machine that allows you to pump milk from your breast into a sterile bottle. The pumped breast milk can then be stored in a refrigerator or freezer. Some breast pumps are operated by hand, while others use electricity. Ask your lactation consultant which type will work best for you. Breast  pumps can be purchased, but some hospitals and breastfeeding support groups lease breast pumps on a monthly basis. A lactation consultant can teach you how to hand express breast milk, if you prefer not to use a pump. Caring for your breasts while you breastfeed Nipples can become dry, cracked, and sore while breastfeeding. The following recommendations can help keep your breasts moisturized and healthy:  Avoid using soap on your nipples.  Wear a supportive bra. Although not required, special nursing bras and tank tops are designed to allow access to your breasts for breastfeeding without taking off your entire bra or top. Avoid wearing underwire-style bras or extremely tight bras.  Air dry your nipples for 3-4minutes after each feeding.  Use only cotton bra pads to absorb leaked breast milk. Leaking of breast milk between feedings is normal.  Use lanolin on your nipples after breastfeeding. Lanolin helps to maintain your skin's normal moisture barrier. If you use pure lanolin, you do not need to wash it off before feeding your baby again. Pure lanolin is not toxic to your baby. You may also hand express a few drops of breast milk and gently massage that milk into your nipples and allow the milk to air dry.  In the first few weeks after giving birth, some women experience extremely full breasts (engorgement). Engorgement can make your breasts feel heavy, warm, and tender to the touch. Engorgement peaks within 3-5 days after you give birth. The following recommendations can help ease engorgement:  Completely empty your breasts while breastfeeding or pumping. You may want to start by applying warm, moist heat (in the shower or with warm water-soaked hand towels) just before feeding or pumping. This increases circulation and helps the milk flow. If your baby does not completely empty your breasts while breastfeeding, pump any extra milk after he or she is finished.  Wear a snug bra (nursing or  regular) or tank top for 1-2 days to signal your body to slightly decrease milk production.  Apply ice packs to your breasts, unless   this is too uncomfortable for you.  Make sure that your baby is latched on and positioned properly while breastfeeding.  If engorgement persists after 48 hours of following these recommendations, contact your health care provider or a lactation consultant. Overall health care recommendations while breastfeeding  Eat healthy foods. Alternate between meals and snacks, eating 3 of each per day. Because what you eat affects your breast milk, some of the foods may make your baby more irritable than usual. Avoid eating these foods if you are sure that they are negatively affecting your baby.  Drink milk, fruit juice, and water to satisfy your thirst (about 10 glasses a day).  Rest often, relax, and continue to take your prenatal vitamins to prevent fatigue, stress, and anemia.  Continue breast self-awareness checks.  Avoid chewing and smoking tobacco. Chemicals from cigarettes that pass into breast milk and exposure to secondhand smoke may harm your baby.  Avoid alcohol and drug use, including marijuana. Some medicines that may be harmful to your baby can pass through breast milk. It is important to ask your health care provider before taking any medicine, including all over-the-counter and prescription medicine as well as vitamin and herbal supplements. It is possible to become pregnant while breastfeeding. If birth control is desired, ask your health care provider about options that will be safe for your baby. Contact a health care provider if:  You feel like you want to stop breastfeeding or have become frustrated with breastfeeding.  You have painful breasts or nipples.  Your nipples are cracked or bleeding.  Your breasts are red, tender, or warm.  You have a swollen area on either breast.  You have a fever or chills.  You have nausea or  vomiting.  You have drainage other than breast milk from your nipples.  Your breasts do not become full before feedings by the fifth day after you give birth.  You feel sad and depressed.  Your baby is too sleepy to eat well.  Your baby is having trouble sleeping.  Your baby is wetting less than 3 diapers in a 24-hour period.  Your baby has less than 3 stools in a 24-hour period.  Your baby's skin or the white part of his or her eyes becomes yellow.  Your baby is not gaining weight by 5 days of age. Get help right away if:  Your baby is overly tired (lethargic) and does not want to wake up and feed.  Your baby develops an unexplained fever. This information is not intended to replace advice given to you by your health care provider. Make sure you discuss any questions you have with your health care provider. Document Released: 02/14/2005 Document Revised: 07/29/2015 Document Reviewed: 08/08/2012 Elsevier Interactive Patient Education  2017 [employer]  

## 2016-08-19 LAB — CBC
Hematocrit: 30.1 % — ABNORMAL LOW (ref 34.0–46.6)
Hemoglobin: 10.4 g/dL — ABNORMAL LOW (ref 11.1–15.9)
MCH: 29.2 pg (ref 26.6–33.0)
MCHC: 34.6 g/dL (ref 31.5–35.7)
MCV: 85 fL (ref 79–97)
Platelets: 248 10*3/uL (ref 150–379)
RBC: 3.56 x10E6/uL — ABNORMAL LOW (ref 3.77–5.28)
RDW: 13.4 % (ref 12.3–15.4)
WBC: 7.5 10*3/uL (ref 3.4–10.8)

## 2016-08-19 LAB — HIV ANTIBODY (ROUTINE TESTING W REFLEX): HIV Screen 4th Generation wRfx: NONREACTIVE

## 2016-08-19 LAB — RPR: RPR Ser Ql: NONREACTIVE

## 2016-08-19 LAB — GLUCOSE TOLERANCE, 2 HOURS W/ 1HR
Glucose, 1 hour: 105 mg/dL (ref 65–179)
Glucose, 2 hour: 92 mg/dL (ref 65–152)
Glucose, Fasting: 74 mg/dL (ref 65–91)

## 2016-09-02 ENCOUNTER — Ambulatory Visit (INDEPENDENT_AMBULATORY_CARE_PROVIDER_SITE_OTHER): Payer: Medicaid Other | Admitting: Family Medicine

## 2016-09-02 VITALS — BP 126/77 | HR 82 | Wt 185.0 lb

## 2016-09-02 DIAGNOSIS — Z3400 Encounter for supervision of normal first pregnancy, unspecified trimester: Secondary | ICD-10-CM

## 2016-09-02 DIAGNOSIS — O99013 Anemia complicating pregnancy, third trimester: Secondary | ICD-10-CM

## 2016-09-02 DIAGNOSIS — R12 Heartburn: Secondary | ICD-10-CM

## 2016-09-02 DIAGNOSIS — O26893 Other specified pregnancy related conditions, third trimester: Secondary | ICD-10-CM

## 2016-09-02 DIAGNOSIS — E669 Obesity, unspecified: Secondary | ICD-10-CM

## 2016-09-02 DIAGNOSIS — Z3403 Encounter for supervision of normal first pregnancy, third trimester: Secondary | ICD-10-CM

## 2016-09-02 MED ORDER — FERROUS SULFATE 325 (65 FE) MG PO TABS: 325.0000 mg | ORAL_TABLET | Freq: Two times a day (BID) | ORAL | 1 refills | Status: DC

## 2016-09-02 MED ORDER — FAMOTIDINE 20 MG PO TABS: 20.0000 mg | ORAL_TABLET | Freq: Two times a day (BID) | ORAL | 3 refills | Status: DC

## 2016-09-02 MED ORDER — DOCUSATE SODIUM 100 MG PO CAPS: 100.0000 mg | ORAL_CAPSULE | Freq: Two times a day (BID) | ORAL | 2 refills | Status: DC | PRN

## 2016-09-02 NOTE — Progress Notes (Signed)
   PRENATAL VISIT NOTE  Subjective:  Kristen [name] is a 26 y.o. G1P0000 at 5269w4d being seen today for ongoing prenatal care.  She is currently monitored for the following issues for this low-risk pregnancy and has Abdominal pain, acute, right lower quadrant; UTI (lower urinary tract infection); Supervision of normal first pregnancy, antepartum; and Sickle cell trait (HCC) on her problem list.  Patient reports heartburn. Taking tums - 6 tabs every day. Heartburn worse at night, even with the Tums. She has been eating ice..  Contractions: Not present. Vag. Bleeding: None.  Movement: Present. Denies leaking of fluid.   The following portions of the patient's history were reviewed and updated as appropriate: allergies, current medications, past family history, past medical history, past social history, past surgical history and problem list. Problem list updated.  Objective:   Vitals:   09/02/16 1047  BP: 126/77  Pulse: 82  Weight: 185 lb (83.9 kg)    Fetal Status: Fetal Heart Rate (bpm): 145   Movement: Present     General:  Alert, oriented and cooperative. Patient is in no acute distress.  Skin: Skin is warm and dry. No rash noted.   Cardiovascular: Normal heart rate noted  Respiratory: Normal respiratory effort, no problems with respiration noted  Abdomen: Soft, gravid, appropriate for gestational age. Pain/Pressure: Absent     Pelvic:  Cervical exam deferred        Extremities: Normal range of motion.  Edema: Trace  Mental Status: Normal mood and affect. Normal behavior. Normal judgment and thought content.   Assessment and Plan:  Pregnancy: G1P0000 at 2769w4d  1. Supervision of normal first pregnancy, antepartum FHT and FH normal.  2. Anemia during pregnancy in third trimester Iron and colace prescribed.  3. Heartburn in pregnancy in third trimester Pepcid prescribed.  Preterm labor symptoms and general obstetric precautions including but not limited to vaginal bleeding,  contractions, leaking of fluid and fetal movement were reviewed in detail with the patient. Please refer to After Visit Summary for other counseling recommendations.  Return in about 2 weeks (around 09/16/2016) for OB f/u.   Levie HeritageJacob J Salik Grewell, DO

## 2016-09-19 ENCOUNTER — Ambulatory Visit (INDEPENDENT_AMBULATORY_CARE_PROVIDER_SITE_OTHER): Payer: Medicaid Other | Admitting: Family Medicine

## 2016-09-19 VITALS — BP 118/80 | HR 68 | Wt 192.0 lb

## 2016-09-19 DIAGNOSIS — Z3403 Encounter for supervision of normal first pregnancy, third trimester: Secondary | ICD-10-CM

## 2016-09-19 DIAGNOSIS — Z3400 Encounter for supervision of normal first pregnancy, unspecified trimester: Secondary | ICD-10-CM

## 2016-09-19 NOTE — Progress Notes (Signed)
   PRENATAL VISIT NOTE  Subjective:  Kristen [name] is a 26 y.o. G1P0000 at 3524w0d being seen today for ongoing prenatal care.  She is currently monitored for the following issues for this low-risk pregnancy and has Abdominal pain, acute, right lower quadrant; UTI (lower urinary tract infection); Supervision of normal first pregnancy, antepartum; and Sickle cell trait (HCC) on her problem list.  Patient reports no complaints.  Contractions: Irritability. Vag. Bleeding: None.  Movement: Present. Denies leaking of fluid.   The following portions of the patient's history were reviewed and updated as appropriate: allergies, current medications, past family history, past medical history, past social history, past surgical history and problem list. Problem list updated.  Objective:   Vitals:   09/19/16 1058 09/19/16 1121  BP: 125/90 118/80  Pulse: 80 68  Weight: 192 lb (87.1 kg)     Fetal Status: Fetal Heart Rate (bpm): 167   Movement: Present     General:  Alert, oriented and cooperative. Patient is in no acute distress.  Skin: Skin is warm and dry. No rash noted.   Cardiovascular: Normal heart rate noted  Respiratory: Normal respiratory effort, no problems with respiration noted  Abdomen: Soft, gravid, appropriate for gestational age.  Pain/Pressure: Present     Pelvic: Cervical exam deferred        Extremities: Normal range of motion.  Edema: Trace  Mental Status:  Normal mood and affect. Normal behavior. Normal judgment and thought content.   Assessment and Plan:  Pregnancy: G1P0000 at 8724w0d  1. Supervision of normal first pregnancy, antepartum FHT and FH normal. Watch BP closely.   Preterm labor symptoms and general obstetric precautions including but not limited to vaginal bleeding, contractions, leaking of fluid and fetal movement were reviewed in detail with the patient. Please refer to After Visit Summary for other counseling recommendations.  Return in about 2 weeks (around  10/03/2016) for OB f/u.   Levie HeritageJacob J Dolphus Linch, DO

## 2016-10-04 ENCOUNTER — Other Ambulatory Visit (HOSPITAL_COMMUNITY)
Admission: RE | Admit: 2016-10-04 | Discharge: 2016-10-04 | Disposition: A | Discharge status: Home / Self Care | Payer: Medicaid Other | Source: Ambulatory Visit | Attending: Advanced Practice Midwife | Admitting: Advanced Practice Midwife

## 2016-10-04 ENCOUNTER — Ambulatory Visit (INDEPENDENT_AMBULATORY_CARE_PROVIDER_SITE_OTHER): Payer: Medicaid Other | Admitting: Advanced Practice Midwife

## 2016-10-04 VITALS — BP 135/78 | HR 73 | Wt 194.0 lb

## 2016-10-04 DIAGNOSIS — Z3490 Encounter for supervision of normal pregnancy, unspecified, unspecified trimester: Secondary | ICD-10-CM

## 2016-10-04 DIAGNOSIS — Z3493 Encounter for supervision of normal pregnancy, unspecified, third trimester: Secondary | ICD-10-CM | POA: Diagnosis not present

## 2016-10-04 DIAGNOSIS — D573 Sickle-cell trait: Secondary | ICD-10-CM

## 2016-10-04 DIAGNOSIS — O99013 Anemia complicating pregnancy, third trimester: Secondary | ICD-10-CM

## 2016-10-04 DIAGNOSIS — Z3403 Encounter for supervision of normal first pregnancy, third trimester: Secondary | ICD-10-CM

## 2016-10-04 LAB — OB RESULTS CONSOLE GC/CHLAMYDIA: Gonorrhea: NEGATIVE

## 2016-10-04 LAB — OB RESULTS CONSOLE GBS: GBS: POSITIVE

## 2016-10-04 NOTE — Progress Notes (Signed)
   PRENATAL VISIT NOTE  Subjective:  Kristen [name] is a 26 y.o. G1P0000 at 8028w1d being seen today for ongoing prenatal care.  She is currently monitored for the following issues for this low-risk pregnancy and has Abdominal pain, acute, right lower quadrant; UTI (lower urinary tract infection); Supervision of normal first pregnancy, antepartum; and Sickle cell trait (HCC) on her problem list.  Patient reports no complaints.  Contractions: Irritability. Vag. Bleeding: None.  Movement: Present. Denies leaking of fluid.   The following portions of the patient's history were reviewed and updated as appropriate: allergies, current medications, past family history, past medical history, past social history, past surgical history and problem list. Problem list updated.  Objective:   Vitals:   10/04/16 0959  BP: 135/78  Pulse: 73  Weight: 194 lb (88 kg)    Fetal Status: Fetal Heart Rate (bpm): 135   Movement: Present     General:  Alert, oriented and cooperative. Patient is in no acute distress.  Skin: Skin is warm and dry. No rash noted.   Cardiovascular: Normal heart rate noted  Respiratory: Normal respiratory effort, no problems with respiration noted  Abdomen: Soft, gravid, appropriate for gestational age.  Pain/Pressure: Present     Pelvic: Cervical exam performed       Closed/soft/40%/Ball  Extremities: Normal range of motion.  Edema: Trace  Mental Status:  Normal mood and affect. Normal behavior. Normal judgment and thought content.   Assessment and Plan:  Pregnancy: G1P0000 at 3928w1d  1. Prenatal care, antepartum      Cultures done       Cervix softening - Culture, beta strep (group b only) - GC/Chlamydia probe amp ([place])not at [facility]  2. Encounter for supervision of normal first pregnancy in third trimester     Reviewed signs of labor, signed FMLA papers  3. Sickle cell trait (HCC)     States FOB is negative  Preterm labor symptoms and general obstetric precautions  including but not limited to vaginal bleeding, contractions, leaking of fluid and fetal movement were reviewed in detail with the patient. Please refer to After Visit Summary for other counseling recommendations.  Return in about 2 weeks (around 10/18/2016) [facility].   Wynelle BourgeoisMarie Jemari Hallum, CNM

## 2016-10-04 NOTE — Patient Instructions (Signed)
Group B Streptococcus Infection During Pregnancy Group B Streptococcus (GBS) is a type of bacteria (Streptococcus agalactiae) that is often found in healthy people, commonly in the rectum, vagina, and intestines. In people who are healthy and not pregnant, the bacteria rarely cause serious illness or complications. However, women who test positive for GBS during pregnancy can pass the bacteria to their baby during childbirth, which can cause serious infection in the baby after birth. Women with GBS may also have infections during their pregnancy or immediately after childbirth, such as such as urinary tract infections (UTIs) or infections of the uterus (uterine infections). Having GBS also increases a woman's risk of complications during pregnancy, such as early (preterm) labor or delivery, miscarriage, or stillbirth. Routine testing (screening) for GBS is recommended for all pregnant women. What increases the risk? You may have a higher risk for GBS infection during pregnancy if you had one during a past pregnancy. What are the signs or symptoms? In most cases, GBS infection does not cause symptoms in pregnant women. Signs and symptoms of a possible GBS-related infection may include:  Labor starting before the 37th week of pregnancy.  A UTI or bladder infection, which may cause: ? Fever. ? Pain or burning during urination. ? Frequent urination.  Fever during labor, along with: ? Bad-smelling discharge. ? Uterine tenderness. ? Rapid heartbeat in the mother, baby, or both.  Rare but serious symptoms of a possible GBS-related infection in women include:  Blood infection (septicemia). This may cause fever, chills, or confusion.  Lung infection (pneumonia). This may cause fever, chills, cough, rapid breathing, difficulty breathing, or chest pain.  Bone, joint, skin, or soft tissue infection.  How is this diagnosed? You may be screened for GBS between week 35 and week 37 of your pregnancy. If  you have symptoms of preterm labor, you may be screened earlier. This condition is diagnosed based on lab test results from:  A swab of fluid from the vagina and rectum.  A urine sample.  How is this treated? This condition is treated with antibiotic medicine. When you go into labor, or as soon as your water breaks (your membranes rupture), you will be given antibiotics through an IV tube. Antibiotics will continue until after you give birth. If you are having a cesarean delivery, you do not need antibiotics unless your membranes have already ruptured. Follow these instructions at home:  Take over-the-counter and prescription medicines only as told by your health care provider.  Take your antibiotic medicine as told by your health care provider. Do not stop taking the antibiotic even if you start to feel better.  Keep all pre-birth (prenatal) visits and follow-up visits as told by your health care provider. This is important. Contact a health care provider if:  You have pain or burning when you urinate.  You have to urinate frequently.  You have a fever or chills.  You develop a bad-smelling vaginal discharge. Get help right away if:  Your membranes rupture.  You go into labor.  You have severe pain in your abdomen.  You have difficulty breathing.  You have chest pain. This information is not intended to replace advice given to you by your health care provider. Make sure you discuss any questions you have with your health care provider. Document Released: 05/24/2007 Document Revised: 09/11/2015 Document Reviewed: 09/10/2015 Elsevier Interactive Patient Education  2018 [employer]  

## 2016-10-05 ENCOUNTER — Other Ambulatory Visit: Payer: Self-pay | Admitting: Advanced Practice Midwife

## 2016-10-05 DIAGNOSIS — O98813 Other maternal infectious and parasitic diseases complicating pregnancy, third trimester: Principal | ICD-10-CM

## 2016-10-05 DIAGNOSIS — A749 Chlamydial infection, unspecified: Secondary | ICD-10-CM | POA: Insufficient documentation

## 2016-10-05 LAB — GC/CHLAMYDIA PROBE AMP (~~LOC~~) NOT AT ARMC
Chlamydia: POSITIVE — AB
Neisseria Gonorrhea: NEGATIVE

## 2016-10-05 MED ORDER — AZITHROMYCIN 500 MG PO TABS: 1000.0000 mg | ORAL_TABLET | Freq: Once | ORAL | 0 refills | Status: AC

## 2016-10-05 NOTE — Progress Notes (Signed)
+   chlamydia Rx sent to pharmacy

## 2016-10-06 ENCOUNTER — Telehealth: Payer: Self-pay

## 2016-10-06 NOTE — Telephone Encounter (Signed)
Left message for patient to return call to office. Armandina StammerJennifer Howard RNBSN  My chart message sent as well. Armandina StammerJennifer Howard RN

## 2016-10-06 NOTE — Telephone Encounter (Signed)
-----   Message from Aviva SignsMarie L Williams, CNM sent at 10/05/2016  9:50 PM EDT ----- Regarding: chlamydia + chlamydia  Can you notify her ?  Thanks

## 2016-10-07 LAB — CULTURE, BETA STREP (GROUP B ONLY): Strep Gp B Culture: POSITIVE — AB

## 2016-10-07 MED ORDER — AZITHROMYCIN 500 MG PO TABS: 500.0000 mg | ORAL_TABLET | Freq: Every day | ORAL | 0 refills | Status: DC

## 2016-10-07 NOTE — Telephone Encounter (Signed)
Patient made aware that her chlamydia come back positive. Patient made aware to abstain from intercourse until her partner tested and treated. Patient states understanding. Armandina StammerJennifer Delane Wessinger RNBSN

## 2016-10-08 ENCOUNTER — Encounter: Payer: Self-pay | Admitting: Advanced Practice Midwife

## 2016-10-08 DIAGNOSIS — B951 Streptococcus, group B, as the cause of diseases classified elsewhere: Secondary | ICD-10-CM | POA: Insufficient documentation

## 2016-10-08 DIAGNOSIS — O98819 Other maternal infectious and parasitic diseases complicating pregnancy, unspecified trimester: Secondary | ICD-10-CM

## 2016-10-17 ENCOUNTER — Ambulatory Visit (INDEPENDENT_AMBULATORY_CARE_PROVIDER_SITE_OTHER): Payer: Medicaid Other | Admitting: Family Medicine

## 2016-10-17 VITALS — BP 133/89 | HR 81 | Wt 199.0 lb

## 2016-10-17 DIAGNOSIS — Z3400 Encounter for supervision of normal first pregnancy, unspecified trimester: Secondary | ICD-10-CM

## 2016-10-17 DIAGNOSIS — O99820 Streptococcus B carrier state complicating pregnancy: Secondary | ICD-10-CM

## 2016-10-17 DIAGNOSIS — O98813 Other maternal infectious and parasitic diseases complicating pregnancy, third trimester: Secondary | ICD-10-CM

## 2016-10-17 DIAGNOSIS — Z3403 Encounter for supervision of normal first pregnancy, third trimester: Secondary | ICD-10-CM

## 2016-10-17 DIAGNOSIS — A749 Chlamydial infection, unspecified: Secondary | ICD-10-CM

## 2016-10-17 NOTE — Progress Notes (Signed)
   PRENATAL VISIT NOTE  Subjective:  Kristen [name] is a 26 y.o. G1P0000 at [redacted]w[redacted]d being seen today for ongoing prenatal care.  She is currently monitored for the following issues for this low-risk pregnancy and has Abdominal pain, acute, right lower quadrant; UTI (lower urinary tract infection); Supervision of normal first pregnancy, antepartum; Sickle cell trait (HCC); Chlamydia infection affecting pregnancy; and Group B streptococcal infection during pregnancy on her problem list.  Patient reports no complaints.  Contractions: Irritability. Vag. Bleeding: None.  Movement: Present. Denies leaking of fluid.   The following portions of the patient's history were reviewed and updated as appropriate: allergies, current medications, past family history, past medical history, past social history, past surgical history and problem list. Problem list updated.  Objective:   Vitals:   10/17/16 0953  BP: 133/89  Pulse: 81  Weight: 199 lb (90.3 kg)    Fetal Status: Fetal Heart Rate (bpm): 140 Fundal Height: 37 cm Movement: Present     General:  Alert, oriented and cooperative. Patient is in no acute distress.  Skin: Skin is warm and dry. No rash noted.   Cardiovascular: Normal heart rate noted  Respiratory: Normal respiratory effort, no problems with respiration noted  Abdomen: Soft, gravid, appropriate for gestational age.  Pain/Pressure: Present     Pelvic: Cervical exam deferred        Extremities: Normal range of motion.  Edema: Trace  Mental Status:  Normal mood and affect. Normal behavior. Normal judgment and thought content.   Assessment and Plan:  Pregnancy: G1P0000 at [redacted]w[redacted]d  1. Supervision of normal first pregnancy, antepartum FHT and Fh normal  2. Chlamydia infection affecting pregnancy in third trimester S/p treatment - TOC in 4-6 weeks  3. GBS (group B Streptococcus carrier), +RV culture, currently pregnant Intrapartum abx  Term labor symptoms and general obstetric  precautions including but not limited to vaginal bleeding, contractions, leaking of fluid and fetal movement were reviewed in detail with the patient. Please refer to After Visit Summary for other counseling recommendations.  Return in about 1 week (around 10/24/2016) for OB f/u.   Levie Heritage, DO

## 2016-10-25 ENCOUNTER — Ambulatory Visit (INDEPENDENT_AMBULATORY_CARE_PROVIDER_SITE_OTHER): Payer: Medicaid Other | Admitting: Advanced Practice Midwife

## 2016-10-25 ENCOUNTER — Encounter (HOSPITAL_COMMUNITY): Payer: Self-pay | Admitting: General Practice

## 2016-10-25 ENCOUNTER — Inpatient Hospital Stay (HOSPITAL_COMMUNITY)
Admission: AD | Admit: 2016-10-25 | Discharge: 2016-10-28 | DRG: 775 | Disposition: A | Discharge status: Home / Self Care | Payer: Medicaid Other | Source: Ambulatory Visit | Attending: Family Medicine | Admitting: Family Medicine

## 2016-10-25 ENCOUNTER — Encounter: Payer: Self-pay | Admitting: Advanced Practice Midwife

## 2016-10-25 VITALS — BP 148/89 | HR 70 | Wt 198.0 lb

## 2016-10-25 DIAGNOSIS — O133 Gestational [pregnancy-induced] hypertension without significant proteinuria, third trimester: Secondary | ICD-10-CM

## 2016-10-25 DIAGNOSIS — O134 Gestational [pregnancy-induced] hypertension without significant proteinuria, complicating childbirth: Secondary | ICD-10-CM | POA: Diagnosis present

## 2016-10-25 DIAGNOSIS — Z3A38 38 weeks gestation of pregnancy: Secondary | ICD-10-CM

## 2016-10-25 DIAGNOSIS — O9902 Anemia complicating childbirth: Secondary | ICD-10-CM | POA: Diagnosis present

## 2016-10-25 DIAGNOSIS — O99824 Streptococcus B carrier state complicating childbirth: Secondary | ICD-10-CM | POA: Diagnosis present

## 2016-10-25 DIAGNOSIS — D573 Sickle-cell trait: Secondary | ICD-10-CM | POA: Diagnosis present

## 2016-10-25 DIAGNOSIS — O139 Gestational [pregnancy-induced] hypertension without significant proteinuria, unspecified trimester: Secondary | ICD-10-CM | POA: Diagnosis present

## 2016-10-25 DIAGNOSIS — O1414 Severe pre-eclampsia complicating childbirth: Secondary | ICD-10-CM | POA: Diagnosis present

## 2016-10-25 LAB — COMPREHENSIVE METABOLIC PANEL WITH GFR
ALT: 11 U/L — ABNORMAL LOW (ref 14–54)
AST: 20 U/L (ref 15–41)
Albumin: 2.9 g/dL — ABNORMAL LOW (ref 3.5–5.0)
Alkaline Phosphatase: 165 U/L — ABNORMAL HIGH (ref 38–126)
Anion gap: 10 (ref 5–15)
BUN: 7 mg/dL (ref 6–20)
CO2: 20 mmol/L — ABNORMAL LOW (ref 22–32)
Calcium: 9.1 mg/dL (ref 8.9–10.3)
Chloride: 106 mmol/L (ref 101–111)
Creatinine, Ser: 0.86 mg/dL (ref 0.44–1.00)
GFR calc Af Amer: >60 mL/min
GFR calc non Af Amer: >60 mL/min
Glucose, Bld: 73 mg/dL (ref 65–99)
Potassium: 3.9 mmol/L (ref 3.5–5.1)
Sodium: 136 mmol/L (ref 135–145)
Total Bilirubin: 0.2 mg/dL — ABNORMAL LOW (ref 0.3–1.2)
Total Protein: 6.5 g/dL (ref 6.5–8.1)

## 2016-10-25 LAB — CBC
HCT: 30.4 % — ABNORMAL LOW (ref 36.0–46.0)
Hemoglobin: 10.2 g/dL — ABNORMAL LOW (ref 12.0–15.0)
MCH: 27.9 pg (ref 26.0–34.0)
MCHC: 33.6 g/dL (ref 30.0–36.0)
MCV: 83.1 fL (ref 78.0–100.0)
Platelets: 245 10*3/uL (ref 150–400)
RBC: 3.66 MIL/uL — ABNORMAL LOW (ref 3.87–5.11)
RDW: 13.6 % (ref 11.5–15.5)
WBC: 8.5 10*3/uL (ref 4.0–10.5)

## 2016-10-25 LAB — TYPE AND SCREEN
ABO/RH(D): B POS
Antibody Screen: NEGATIVE

## 2016-10-25 LAB — PROTEIN / CREATININE RATIO, URINE
Creatinine, Urine: 158 mg/dL
Protein Creatinine Ratio: 0.09 mg/mg{Cre} (ref 0.00–0.15)
Total Protein, Urine: 15 mg/dL

## 2016-10-25 LAB — ABO/RH: ABO/RH(D): B POS

## 2016-10-25 MED ORDER — FENTANYL CITRATE (PF) 100 MCG/2ML IJ SOLN
100.0000 ug | INTRAMUSCULAR | Status: DC | PRN
  Administered 2016-10-25: 100 ug via INTRAVENOUS
  Filled 2016-10-25 (×2): qty 2

## 2016-10-25 MED ORDER — LABETALOL HCL 5 MG/ML IV SOLN
20.0000 mg | INTRAVENOUS | Status: DC | PRN
  Administered 2016-10-25: 20 mg via INTRAVENOUS
  Filled 2016-10-25: qty 4

## 2016-10-25 MED ORDER — MISOPROSTOL 25 MCG QUARTER TABLET
25.0000 ug | ORAL_TABLET | ORAL | Status: DC | PRN
  Administered 2016-10-25 (×2): 25 ug via VAGINAL
  Filled 2016-10-25 (×3): qty 1

## 2016-10-25 MED ORDER — SOD CITRATE-CITRIC ACID 500-334 MG/5ML PO SOLN: 30.0000 mL | ORAL | Status: DC | PRN

## 2016-10-25 MED ORDER — OXYTOCIN 40 UNITS IN LACTATED RINGERS INFUSION - SIMPLE MED
2.5000 [IU]/h | INTRAVENOUS | Status: DC
  Filled 2016-10-25: qty 1000

## 2016-10-25 MED ORDER — OXYTOCIN 40 UNITS IN LACTATED RINGERS INFUSION - SIMPLE MED
1.0000 m[IU]/min | INTRAVENOUS | Status: DC
  Administered 2016-10-26: 2 m[IU]/min via INTRAVENOUS
  Filled 2016-10-25: qty 1000

## 2016-10-25 MED ORDER — PENICILLIN G POTASSIUM 5000000 UNITS IJ SOLR
5.0000 10*6.[IU] | Freq: Once | INTRAVENOUS | Status: AC
  Administered 2016-10-26: 5 10*6.[IU] via INTRAVENOUS
  Filled 2016-10-25: qty 5

## 2016-10-25 MED ORDER — LACTATED RINGERS IV SOLN
INTRAVENOUS | Status: DC
  Administered 2016-10-25: 15:00:00 via INTRAVENOUS

## 2016-10-25 MED ORDER — LIDOCAINE HCL (PF) 1 % IJ SOLN
30.0000 mL | INTRAMUSCULAR | Status: DC | PRN
  Filled 2016-10-25: qty 30

## 2016-10-25 MED ORDER — ACETAMINOPHEN 325 MG PO TABS: 650.0000 mg | ORAL_TABLET | ORAL | Status: DC | PRN

## 2016-10-25 MED ORDER — TERBUTALINE SULFATE 1 MG/ML IJ SOLN: 0.2500 mg | Freq: Once | INTRAMUSCULAR | Status: DC | PRN

## 2016-10-25 MED ORDER — ONDANSETRON HCL 4 MG/2ML IJ SOLN: 4.0000 mg | Freq: Four times a day (QID) | INTRAMUSCULAR | Status: DC | PRN

## 2016-10-25 MED ORDER — FLEET ENEMA 7-19 GM/118ML RE ENEM: 1.0000 | ENEMA | RECTAL | Status: DC | PRN

## 2016-10-25 MED ORDER — OXYCODONE-ACETAMINOPHEN 5-325 MG PO TABS: 1.0000 | ORAL_TABLET | ORAL | Status: DC | PRN

## 2016-10-25 MED ORDER — HYDRALAZINE HCL 20 MG/ML IJ SOLN: 10.0000 mg | Freq: Once | INTRAMUSCULAR | Status: DC | PRN

## 2016-10-25 MED ORDER — LACTATED RINGERS IV SOLN: 500.0000 mL | INTRAVENOUS | Status: DC | PRN

## 2016-10-25 MED ORDER — OXYTOCIN BOLUS FROM INFUSION
500.0000 mL | Freq: Once | INTRAVENOUS | Status: AC
  Administered 2016-10-26: 500 mL via INTRAVENOUS

## 2016-10-25 MED ORDER — PENICILLIN G POT IN DEXTROSE 60000 UNIT/ML IV SOLN
3.0000 10*6.[IU] | INTRAVENOUS | Status: DC
  Administered 2016-10-26 (×3): 3 10*6.[IU] via INTRAVENOUS
  Filled 2016-10-25 (×8): qty 50

## 2016-10-25 MED ORDER — OXYCODONE-ACETAMINOPHEN 5-325 MG PO TABS: 2.0000 | ORAL_TABLET | ORAL | Status: DC | PRN

## 2016-10-25 NOTE — Anesthesia Pain Management Evaluation Note (Signed)
  CRNA Pain Management Visit Note  Patient: Kristen [name], 26 y.o., female  "Hello I am a member of the anesthesia team at [facility]. We have an anesthesia team available at all times to provide care throughout the hospital, including epidural management and anesthesia for C-section. I don't know your plan for the delivery whether it a natural birth, water birth, IV sedation, nitrous supplementation, doula or epidural, but we want to meet your pain goals."   1.Was your pain managed to your expectations on prior hospitalizations?   No prior hospitalizations  2.What is your expectation for pain management during this hospitalization?     IV pain meds  3.How can we help you reach that goal? unsure  Record the patient's initial score and the patient's pain goal.   Pain: 0  Pain Goal: 5 [facility] wants you to be able to say your pain was always managed very well.  Cephus Shelling 10/25/2016

## 2016-10-25 NOTE — Progress Notes (Signed)
   PRENATAL VISIT NOTE  Subjective:  Kristen [name] is a 26 y.o. G1P0000 at [redacted]w[redacted]d being seen today for ongoing prenatal care.  She is currently monitored for the following issues for this high-risk pregnancy and has Abdominal pain, acute, right lower quadrant; UTI (lower urinary tract infection); Supervision of normal first pregnancy, antepartum; Sickle cell trait (HCC); Chlamydia infection affecting pregnancy in third trimester; Group B streptococcal infection during pregnancy; GBS (group B Streptococcus carrier), +RV culture, currently pregnant; and Gestational hypertension on her problem list.  Patient reports headache and increased swelling in ankles.   . Vag. Bleeding: None.  Movement: Present. Denies leaking of fluid.   The following portions of the patient's history were reviewed and updated as appropriate: allergies, current medications, past family history, past medical history, past social history, past surgical history and problem list. Problem list updated.  Objective:   Vitals:   10/25/16 1026 10/25/16 1040  BP: (!) 150/96 (!) 148/89  Pulse: 66 70  Weight: 198 lb (89.8 kg)     Fetal Status: Fetal Heart Rate (bpm): 135   Movement: Present     General:  Alert, oriented and cooperative. Patient is in no acute distress.  Skin: Skin is warm and dry. No rash noted.   Cardiovascular: Normal heart rate noted  Respiratory: Normal respiratory effort, no problems with respiration noted  Abdomen: Soft, gravid, appropriate for gestational age.  Pain/Pressure: Present     Pelvic: Cervical exam deferred        Extremities: Normal range of motion.  Edema: Mild pitting, slight indentation  Mental Status:  Normal mood and affect. Normal behavior. Normal judgment and thought content.                DTRs 2+, no clonus               2+ pretibial and pedal edema  Assessment and Plan:  Pregnancy: G1P0000 at [redacted]w[redacted]d  1. Pregnancy-induced hypertension in third trimester     Consulted Dr  Quinn Plowman to [facility] for IOL,  DIscussed will need labs etc and will likely need ripening     Dr Nira Retort and unit staff notified  Term labor symptoms and general obstetric precautions including but not limited to vaginal bleeding, contractions, leaking of fluid and fetal movement were reviewed in detail with the patient. Please refer to After Visit Summary for other counseling recommendations.  Return in about 4 weeks (around 11/22/2016).   Wynelle Bourgeois, CNM

## 2016-10-25 NOTE — H&P (Signed)
OB HISTORY AND PHYSICAL  Kristen [name] is a 26 y.o. female G1P0000 at [redacted]w[redacted]d admitted for induction of labor presenting for pregnancy induced hypertension. She has a history for sickle cell trait, chlamydia in third trimester.  She reports bilateral lower extremity edema. She denies vaginal bleeding, headaches, n/v, fever/chills, dizziness.  [facility] Prenatal Labs  Dating  11 wk [place] c/w LMP Blood type: B/Positive/-- (02/22 1103)   Genetic Screen Quad--neg Antibody:Negative (02/22 1103)  Anatomic [place] Limited views of cranium, otherwise normal Rubella: 3.61 (02/22 1103)  GTT  Third trimester: 74/105/92 RPR: Non Reactive (02/22 1103)   Flu vaccine  HBsAg: Negative (02/22 1103)   TDaP vaccine 08/18/2016                                           HIV: Non Reactive (02/22 1103)   Baby Food  Breast                                             GBS: (For PCN allergy, check sensitivities) Positive    OB History    Gravida Para Term Preterm AB Living   1 0 0 0 0 0   SAB TAB Ectopic Multiple Live Births   0 0 0 0       Past Medical History:  Diagnosis Date  . Bacterial vaginitis 07/01/2016  . Medical history non-contributory    Past Surgical History:  Procedure Laterality Date  . NO PAST SURGERIES     Family History: family history includes Heart disease in her father. Social History:  reports that she has never smoked. She has never used smokeless tobacco. She reports that she drinks alcohol. She reports that she does not use drugs.     Maternal Diabetes: No Genetic Screening: Normal Maternal Ultrasounds/Referrals: Normal Fetal Ultrasounds or other Referrals:  None Maternal Substance Abuse:  No Significant Maternal Medications:  None Significant Maternal Lab Results:  Lab values include: Group B Strep positive Other Comments:  None  Review of Systems  Constitutional: Negative for chills and fever.  Eyes: Negative for blurred vision.  Respiratory: Negative for shortness of  breath.   Cardiovascular: Positive for leg swelling. Negative for chest pain.  Gastrointestinal: Negative for nausea and vomiting.  Genitourinary: Negative for dysuria, frequency and urgency.  Musculoskeletal: Negative.   Neurological: Negative for dizziness and weakness.   Maternal Medical History:  Reason for admission: Nausea.      Blood pressure 133/89, pulse 91, temperature 99.1 F (37.3 C), temperature source Oral, resp. rate 18, height 5\' 4"  (1.626 m), weight 89.8 kg (198 lb), last menstrual period 02/01/2016. Exam Physical Exam  Constitutional: She is oriented to person, place, and time. She appears well-developed and well-nourished.  HENT:  Head: Normocephalic and atraumatic.  Cardiovascular: Normal rate, regular rhythm, normal heart sounds and intact distal pulses.   Respiratory: Effort normal and breath sounds normal. No respiratory distress.  Neurological: She is alert and oriented to person, place, and time.  Psychiatric: She has a normal mood and affect.    Fetal HR: baseline 135, moderate variability, +accels, no decels, toco 2-5 min.  Prenatal labs: ABO, Rh: B/Positive/-- (02/22 1103) Antibody: Negative (02/22 1103) Rubella: 3.61 (02/22 1103) RPR: Non Reactive (06/21 0903)  HBsAg:  Negative (02/22 1103)  HIV:    GBS: Positive (08/07 0000)   Assessment/Plan: 26 y.o. G1P0000 with IUP at [redacted]w[redacted]d being admitted for induction of labor for gestation hypertension.  #GHTN: check PIH labs #Labor: start cervical ripening with cytotec vaginally #Pain control: per patient request #FWB: category 1 #ID: [id number] #MOF: breast #MOC: depo #Circ: n/a  Anna Genre 10/25/2016, 2:37 PM  OB FELLOW MEDICAL STUDENT NOTE ATTESTATION  I confirm that I have verified the information documented in the medical student's note and that I have also personally performed the physical exam and all medical decision making activities.  IOL for gestational HTN. UPC 0.09. CBC and CMP  wnl. Cytotec placed. Will place FB when able.  Chlamydia in 3rd trimester. Treated 3 weeks ago. Will get TOC  Frederik Pear, MD OB Fellow 10/25/2016,

## 2016-10-25 NOTE — Patient Instructions (Signed)
Hypertension During Pregnancy °Hypertension, commonly called high blood pressure, is when the force of blood pumping through your arteries is too strong. Arteries are blood vessels that carry blood from the heart throughout the body. Hypertension during pregnancy can cause problems for you and your baby. Your baby may be born early (prematurely) or may not weigh as much as he or she should at birth. Very bad cases of hypertension during pregnancy can be life-threatening. °Different types of hypertension can occur during pregnancy. These include: °· Chronic hypertension. This happens when: °? You have hypertension before pregnancy and it continues during pregnancy. °? You develop hypertension before you are [redacted] weeks pregnant, and it continues during pregnancy. °· Gestational hypertension. This is hypertension that develops after the 20th week of pregnancy. °· Preeclampsia, also called toxemia of pregnancy. This is a very serious type of hypertension that develops only during pregnancy. It affects the whole body, and it can be very dangerous for you and your baby. ° °Gestational hypertension and preeclampsia usually go away within 6 weeks after your baby is born. Women who have hypertension during pregnancy have a greater chance of developing hypertension later in life or during future pregnancies. °What are the causes? °The exact cause of hypertension is not known. °What increases the risk? °There are certain factors that make it more likely for you to develop hypertension during pregnancy. These include: °· Having hypertension during a previous pregnancy or prior to pregnancy. °· Being overweight. °· Being older than age 40. °· Being pregnant for the first time or being pregnant with more than one baby. °· Becoming pregnant using fertilization methods such as IVF (in vitro fertilization). °· Having diabetes, kidney problems, or systemic lupus erythematosus. °· Having a family history of hypertension. ° °What are the  signs or symptoms? °Chronic hypertension and gestational hypertension rarely cause symptoms. Preeclampsia causes symptoms, which may include: °· Increased protein in your urine. Your health care provider will check for this at every visit before you give birth (prenatal visit). °· Severe headaches. °· Sudden weight gain. °· Swelling of the hands, face, legs, and feet. °· Nausea and vomiting. °· Vision problems, such as blurred or double vision. °· Numbness in the face, arms, legs, and feet. °· Dizziness. °· Slurred speech. °· Sensitivity to bright lights. °· Abdominal pain. °· Convulsions. ° °How is this diagnosed? °You may be diagnosed with hypertension during a routine prenatal exam. At each prenatal visit, you may: °· Have a urine test to check for high amounts of protein in your urine. °· Have your blood pressure checked. A blood pressure reading is recorded as two numbers, such as "120 over 80" (or 120/80). The first ("top") number is called the systolic pressure. It is a measure of the pressure in your arteries when your heart beats. The second ("bottom") number is called the diastolic pressure. It is a measure of the pressure in your arteries as your heart relaxes between beats. Blood pressure is measured in a unit called mm Hg. A normal blood pressure reading is: °? Systolic: below 120. °? Diastolic: below 80. ° °The type of hypertension that you are diagnosed with depends on your test results and when your symptoms developed. °· Chronic hypertension is usually diagnosed before 20 weeks of pregnancy. °· Gestational hypertension is usually diagnosed after 20 weeks of pregnancy. °· Hypertension with high amounts of protein in the urine is diagnosed as preeclampsia. °· Blood pressure measurements that stay above 160 systolic, or above 110 diastolic, are   signs of severe preeclampsia. ° °How is this treated? °Treatment for hypertension during pregnancy varies depending on the type of hypertension you have and how  serious it is. °· If you take medicines called ACE inhibitors to treat chronic hypertension, you may need to switch medicines. ACE inhibitors should not be taken during pregnancy. °· If you have gestational hypertension, you may need to take blood pressure medicine. °· If you are at risk for preeclampsia, your health care provider may recommend that you take a low-dose aspirin every day to prevent high blood pressure during your pregnancy. °· If you have severe preeclampsia, you may need to be hospitalized so you and your baby can be monitored closely. You may also need to take medicine (magnesium sulfate) to prevent seizures and to lower blood pressure. This medicine may be given as an injection or through an IV tube. °· In some cases, if your condition gets worse, you may need to deliver your baby early. ° °Follow these instructions at home: °Eating and drinking °· Drink enough fluid to keep your urine clear or pale yellow. °· Eat a healthy diet that is low in salt (sodium). Do not add salt to your food. Check food labels to see how much sodium a food or beverage contains. °Lifestyle °· Do not use any products that contain nicotine or tobacco, such as cigarettes and e-cigarettes. If you need help quitting, ask your health care provider. °· Do not use alcohol. °· Avoid caffeine. °· Avoid stress as much as possible. Rest and get plenty of sleep. °General instructions °· Take over-the-counter and prescription medicines only as told by your health care provider. °· While lying down, lie on your left side. This keeps pressure off your baby. °· While sitting or lying down, raise (elevate) your feet. Try putting some pillows under your lower legs. °· Exercise regularly. Ask your health care provider what kinds of exercise are best for you. °· Keep all prenatal and follow-up visits as told by your health care provider. This is important. °Contact a health care provider if: °· You have symptoms that your health care  provider told you may require more treatment or monitoring, such as: °? Fever. °? Vomiting. °? Headache. °Get help right away if: °· You have severe abdominal pain or vomiting that does not get better with treatment. °· You suddenly develop swelling in your hands, ankles, or face. °· You gain 4 lbs (1.8 kg) or more in 1 week. °· You develop vaginal bleeding, or you have blood in your urine. °· You do not feel your baby moving as much as usual. °· You have blurred or double vision. °· You have muscle twitching or sudden tightening (spasms). °· You have shortness of breath. °· Your lips or fingernails turn blue. °This information is not intended to replace advice given to you by your health care provider. Make sure you discuss any questions you have with your health care provider. °Document Released: 11/02/2010 Document Revised: 09/04/2015 Document Reviewed: 07/31/2015 °Elsevier Interactive Patient Education © 2018 [employer] ° °

## 2016-10-25 NOTE — Progress Notes (Addendum)
Labor Progress Note  S: Patient seen & examined for progress of labor. Patient comfortable during infrequent contractions.  Patient was agreeable to placement of foley bulb.    O: BP 126/67   Pulse 78   Temp 99.3 F (37.4 C) (Oral)   Resp 16   Ht 5\' 4"  (1.626 m)   Wt 89.8 kg (198 lb)   LMP 02/01/2016 (Approximate)   BMI 33.99 kg/m   FHT: 130bpm, mod var, +accels, no decels TOCO: widely spaced, patient looks comfortable during contractions  CVE: Dilation: 1.5 Effacement (%): 50 Station: -3 Presentation: Vertex Exam by:: Joellyn Haff, CNM  A&P: 26 y.o. G1P0000 [redacted]w[redacted]d here for IOL for gHTN.  Will continue to monitor blood pressures closely.  Last pressure was 126/67.  Will give labetalol for high range pressures. S/p vaginal cytotec x 2 Foley bulb placed Continue induction Anticipate SVD  Lezlie Octave, MD FM Resident PGY-1 10/25/2016 9:39 PM

## 2016-10-26 ENCOUNTER — Inpatient Hospital Stay (HOSPITAL_COMMUNITY): Payer: Medicaid Other | Admitting: Anesthesiology

## 2016-10-26 ENCOUNTER — Encounter (HOSPITAL_COMMUNITY): Payer: Self-pay | Admitting: Obstetrics

## 2016-10-26 DIAGNOSIS — Z3A38 38 weeks gestation of pregnancy: Secondary | ICD-10-CM

## 2016-10-26 DIAGNOSIS — O99824 Streptococcus B carrier state complicating childbirth: Secondary | ICD-10-CM

## 2016-10-26 DIAGNOSIS — O134 Gestational [pregnancy-induced] hypertension without significant proteinuria, complicating childbirth: Secondary | ICD-10-CM

## 2016-10-26 LAB — RPR: RPR Ser Ql: NONREACTIVE

## 2016-10-26 LAB — CBC
HCT: 32.3 % — ABNORMAL LOW (ref 36.0–46.0)
HCT: 31 % — ABNORMAL LOW (ref 36.0–46.0)
Hemoglobin: 10.8 g/dL — ABNORMAL LOW (ref 12.0–15.0)
Hemoglobin: 10.4 g/dL — ABNORMAL LOW (ref 12.0–15.0)
MCH: 28 pg (ref 26.0–34.0)
MCH: 27.8 pg (ref 26.0–34.0)
MCHC: 33.4 g/dL (ref 30.0–36.0)
MCHC: 33.5 g/dL (ref 30.0–36.0)
MCV: 83.7 fL (ref 78.0–100.0)
MCV: 82.9 fL (ref 78.0–100.0)
Platelets: 251 10*3/uL (ref 150–400)
Platelets: 245 10*3/uL (ref 150–400)
RBC: 3.86 MIL/uL — ABNORMAL LOW (ref 3.87–5.11)
RBC: 3.74 MIL/uL — ABNORMAL LOW (ref 3.87–5.11)
RDW: 13.8 % (ref 11.5–15.5)
RDW: 13.7 % (ref 11.5–15.5)
WBC: 12.8 10*3/uL — ABNORMAL HIGH (ref 4.0–10.5)
WBC: 15.7 10*3/uL — ABNORMAL HIGH (ref 4.0–10.5)

## 2016-10-26 MED ORDER — BENZOCAINE-MENTHOL 20-0.5 % EX AERO
1.0000 "application " | INHALATION_SPRAY | CUTANEOUS | Status: DC | PRN
  Administered 2016-10-27: 1 via TOPICAL
  Filled 2016-10-26: qty 56

## 2016-10-26 MED ORDER — COCONUT OIL OIL
1.0000 "application " | TOPICAL_OIL | Status: DC | PRN
  Administered 2016-10-27: 1 via TOPICAL
  Filled 2016-10-26: qty 120

## 2016-10-26 MED ORDER — ONDANSETRON HCL 4 MG/2ML IJ SOLN: 4.0000 mg | INTRAMUSCULAR | Status: DC | PRN

## 2016-10-26 MED ORDER — PHENYLEPHRINE 40 MCG/ML (10ML) SYRINGE FOR IV PUSH (FOR BLOOD PRESSURE SUPPORT)
80.0000 ug | PREFILLED_SYRINGE | INTRAVENOUS | Status: DC | PRN
  Filled 2016-10-26: qty 5

## 2016-10-26 MED ORDER — TETANUS-DIPHTH-ACELL PERTUSSIS 5-2.5-18.5 LF-MCG/0.5 IM SUSP: 0.5000 mL | Freq: Once | INTRAMUSCULAR | Status: DC

## 2016-10-26 MED ORDER — MAGNESIUM SULFATE 40 G IN LACTATED RINGERS - SIMPLE
2.0000 g/h | INTRAVENOUS | Status: AC
  Administered 2016-10-26: 2 g/h via INTRAVENOUS
  Filled 2016-10-26: qty 500
  Filled 2016-10-26: qty 40

## 2016-10-26 MED ORDER — DIPHENHYDRAMINE HCL 25 MG PO CAPS: 25.0000 mg | ORAL_CAPSULE | Freq: Four times a day (QID) | ORAL | Status: DC | PRN

## 2016-10-26 MED ORDER — DIPHENHYDRAMINE HCL 50 MG/ML IJ SOLN: 12.5000 mg | INTRAMUSCULAR | Status: DC | PRN

## 2016-10-26 MED ORDER — LIDOCAINE HCL (PF) 1 % IJ SOLN
INTRAMUSCULAR | Status: DC | PRN
  Administered 2016-10-26 (×2): 5 mL

## 2016-10-26 MED ORDER — IBUPROFEN 600 MG PO TABS
600.0000 mg | ORAL_TABLET | Freq: Four times a day (QID) | ORAL | Status: DC
  Administered 2016-10-26 – 2016-10-28 (×7): 600 mg via ORAL
  Filled 2016-10-26 (×7): qty 1

## 2016-10-26 MED ORDER — PRENATAL MULTIVITAMIN CH
1.0000 | ORAL_TABLET | Freq: Every day | ORAL | Status: DC
  Administered 2016-10-27: 1 via ORAL
  Filled 2016-10-26: qty 1

## 2016-10-26 MED ORDER — ACETAMINOPHEN 325 MG PO TABS: 650.0000 mg | ORAL_TABLET | ORAL | Status: DC | PRN

## 2016-10-26 MED ORDER — MISOPROSTOL 200 MCG PO TABS
ORAL_TABLET | ORAL | Status: AC
  Administered 2016-10-26: 800 ug via RECTAL
  Filled 2016-10-26: qty 4

## 2016-10-26 MED ORDER — EPHEDRINE 5 MG/ML INJ
10.0000 mg | INTRAVENOUS | Status: DC | PRN
  Filled 2016-10-26: qty 2

## 2016-10-26 MED ORDER — MAGNESIUM SULFATE BOLUS VIA INFUSION
4.0000 g | Freq: Once | INTRAVENOUS | Status: AC
  Administered 2016-10-26: 4 g via INTRAVENOUS
  Filled 2016-10-26: qty 500

## 2016-10-26 MED ORDER — PHENYLEPHRINE 40 MCG/ML (10ML) SYRINGE FOR IV PUSH (FOR BLOOD PRESSURE SUPPORT)
80.0000 ug | PREFILLED_SYRINGE | INTRAVENOUS | Status: DC | PRN
  Filled 2016-10-26: qty 5
  Filled 2016-10-26: qty 10

## 2016-10-26 MED ORDER — FENTANYL 2.5 MCG/ML BUPIVACAINE 1/10 % EPIDURAL INFUSION (WH - ANES)
14.0000 mL/h | INTRAMUSCULAR | Status: DC | PRN
  Administered 2016-10-26 (×2): 14 mL/h via EPIDURAL
  Filled 2016-10-26 (×2): qty 100

## 2016-10-26 MED ORDER — DIBUCAINE 1 % RE OINT: 1.0000 "application " | TOPICAL_OINTMENT | RECTAL | Status: DC | PRN

## 2016-10-26 MED ORDER — LACTATED RINGERS IV SOLN
INTRAVENOUS | Status: DC
  Administered 2016-10-27: 05:00:00 via INTRAVENOUS

## 2016-10-26 MED ORDER — ONDANSETRON HCL 4 MG PO TABS: 4.0000 mg | ORAL_TABLET | ORAL | Status: DC | PRN

## 2016-10-26 MED ORDER — ZOLPIDEM TARTRATE 5 MG PO TABS: 5.0000 mg | ORAL_TABLET | Freq: Every evening | ORAL | Status: DC | PRN

## 2016-10-26 MED ORDER — SENNOSIDES-DOCUSATE SODIUM 8.6-50 MG PO TABS
2.0000 | ORAL_TABLET | ORAL | Status: DC
  Administered 2016-10-26 – 2016-10-27 (×2): 2 via ORAL
  Filled 2016-10-26 (×2): qty 2

## 2016-10-26 MED ORDER — MISOPROSTOL 200 MCG PO TABS
800.0000 ug | ORAL_TABLET | Freq: Once | ORAL | Status: AC
  Administered 2016-10-26: 800 ug via RECTAL

## 2016-10-26 MED ORDER — SIMETHICONE 80 MG PO CHEW: 80.0000 mg | CHEWABLE_TABLET | ORAL | Status: DC | PRN

## 2016-10-26 MED ORDER — WITCH HAZEL-GLYCERIN EX PADS: 1.0000 "application " | MEDICATED_PAD | CUTANEOUS | Status: DC | PRN

## 2016-10-26 MED ORDER — LACTATED RINGERS IV SOLN: 500.0000 mL | Freq: Once | INTRAVENOUS | Status: DC

## 2016-10-26 NOTE — Progress Notes (Signed)
Patient ID: Kristen [name], female   DOB: 09/27/1990, 26 y.o.   MRN: [id number] .Dilation: 10 Dilation Complete Date: 10/26/16 Dilation Complete Time: 1230 Effacement (%): 80, 90 Station: 0 Presentation: Vertex Exam by:: stone rnc   FHR stable Pushing Anticipate SVD soon

## 2016-10-26 NOTE — Anesthesia Preprocedure Evaluation (Signed)
Anesthesia Evaluation  Patient identified by MRN, date of birth, ID band Patient awake    Reviewed: Allergy & Precautions, H&P , NPO status , Patient's Chart, lab work & pertinent test results  History of Anesthesia Complications Negative for: history of anesthetic complications  Airway Mallampati: II  TM Distance: >3 FB Neck ROM: full    Dental no notable dental hx. (+) Teeth Intact   Pulmonary neg pulmonary ROS,    Pulmonary exam normal breath sounds clear to auscultation       Cardiovascular hypertension, Normal cardiovascular exam Rhythm:regular Rate:Normal     Neuro/Psych negative neurological ROS  negative psych ROS   GI/Hepatic negative GI ROS, Neg liver ROS,   Endo/Other  negative endocrine ROS  Renal/GU negative Renal ROS  negative genitourinary   Musculoskeletal   Abdominal   Peds  Hematology negative hematology ROS (+)   Anesthesia Other Findings   Reproductive/Obstetrics (+) Pregnancy                             Anesthesia Physical Anesthesia Plan  ASA: II  Anesthesia Plan: Epidural   Post-op Pain Management:    Induction:   PONV Risk Score and Plan:   Airway Management Planned:   Additional Equipment:   Intra-op Plan:   Post-operative Plan:   Informed Consent: I have reviewed the patients History and Physical, chart, labs and discussed the procedure including the risks, benefits and alternatives for the proposed anesthesia with the patient or authorized representative who has indicated his/her understanding and acceptance.     Plan Discussed with:   Anesthesia Plan Comments:         Anesthesia Quick Evaluation  

## 2016-10-26 NOTE — Progress Notes (Signed)
Spoke with Dr. Frances Furbish about increased blood pressures once again.  Patient received 20mg  Labetalol earlier in the night.  Decision made to hold off on treatment of blood pressure until pending epidural placed.  Patient asymptomatic.

## 2016-10-26 NOTE — Plan of Care (Signed)
Problem: Life Cycle: Goal: Risk for postpartum hemorrhage will decrease Outcome: Progressing Fundal tone firm, U/2, midline.  Scant lochia upon assessment.

## 2016-10-26 NOTE — Progress Notes (Addendum)
Labor Progress Note  S: Patient seen & examined for progress of labor. Patient received epidural but is uncomfortable due to pelvic pressure during contractions.  O: BP 139/87   Pulse 79   Temp 98.6 F (37 C) (Oral)   Resp 16   Ht 5\' 4"  (1.626 m)   Wt 89.8 kg (198 lb)   LMP 02/01/2016 (Approximate)   BMI 33.99 kg/m   FHT: 135bpm, mod var, +accels, early decels TOCO: q3-9min, patient looks uncomfortable during contractions  CVE: Dilation: 5.5 Effacement (%): 70 Station: -2 Presentation: Vertex Exam by:: Melburn Popper, RN  A&P: 26 y.o. G1P0000 [redacted]w[redacted]d here for IOL for gHTN.  Will continue to monitor blood pressures closely.  Last pressure was 139/87.  Received 20 mg labetalol for high blood pressure at 2321.  Will continue to give labetalol PRN for high range pressures. S/p vaginal cytotec x 2 S/p foley bulb. Currently on 53milli-units/min pitocin. Continue induction Anticipate SVD  Lezlie Octave, MD FM Resident PGY-1 10/26/2016 5:36 AM    I spoke with and examined patient and agree with resident/PA/SNM's note and plan of care.  Also, will start magnesium per protocol, review of bp's indicates multiple severe range systolic bp's overnight (160s). Received 1 dose of IV labetalol @ 2320.  Cheral Marker, CNM, [facility] 10/26/2016 6:23 AM

## 2016-10-26 NOTE — Anesthesia Procedure Notes (Signed)
Epidural Patient location during procedure: OB  Staffing Anesthesiologist: Fed Ceci Performed: anesthesiologist   Preanesthetic Checklist Completed: patient identified, site marked, surgical consent, pre-op evaluation, timeout performed, IV checked, risks and benefits discussed and monitors and equipment checked  Epidural Patient position: sitting Prep: DuraPrep Patient monitoring: heart rate, continuous pulse ox and blood pressure Approach: right paramedian Location: L3-L4 Injection technique: LOR saline  Needle:  Needle type: Tuohy  Needle gauge: 17 G Needle length: 9 cm and 9 Needle insertion depth: 6 cm Catheter type: closed end flexible Catheter size: 20 Guage Catheter at skin depth: 10 cm Test dose: negative  Assessment Events: blood not aspirated, injection not painful, no injection resistance, negative IV test and no paresthesia  Additional Notes Patient identified. Risks/Benefits/Options discussed with patient including but not limited to bleeding, infection, nerve damage, paralysis, failed block, incomplete pain control, headache, blood pressure changes, nausea, vomiting, reactions to medication both or allergic, itching and postpartum back pain. Confirmed with bedside nurse the patient's most recent platelet count. Confirmed with patient that they are not currently taking any anticoagulation, have any bleeding history or any family history of bleeding disorders. Patient expressed understanding and wished to proceed. All questions were answered. Sterile technique was used throughout the entire procedure. Please see nursing notes for vital signs. Test dose was given through epidural needle and negative prior to continuing to dose epidural or start infusion. Warning signs of high block given to the patient including shortness of breath, tingling/numbness in hands, complete motor block, or any concerning symptoms with instructions to call for help. Patient was given  instructions on fall risk and not to get out of bed. All questions and concerns addressed with instructions to call with any issues.     

## 2016-10-26 NOTE — Progress Notes (Signed)
Labor Progress Note  S: Patient seen & examined for progress of labor. Patient becoming more uncomfortable and is having to breathe through contractions.  She feels contractions about every 5 minutes.  Will want an epidural in the future.  O: BP (!) 142/80   Pulse 62   Temp 99.3 F (37.4 C) (Oral)   Resp 18   Ht 5\' 4"  (1.626 m)   Wt 89.8 kg (198 lb)   LMP 02/01/2016 (Approximate)   BMI 33.99 kg/m   FHT: 135bpm, mod var, +accels, no decels TOCO: q3-626min, patient looks uncomfortable during contractions  CVE: Dilation: 4 Effacement (%): 50 Station: -2 Presentation: Vertex Exam by:: Melburn PopperMichelle Williams, RN  A&P: 26 y.o. G1P0000 248w1d here for IOL for gHTN.  Will continue to monitor blood pressures closely.  Last pressure was 142/80.  Has received 20 mg labetalol for high blood pressure at 2321.  Will continue to give labetalol PRN for high range pressures. S/p vaginal cytotec x 2 S/p foley bulb. Will start pitocin 2x2 due to slow cervical change over last 2 hours. Continue induction Anticipate SVD  Lezlie OctaveAmanda Winfrey, MD [facility] PGY[facility]1 10/26/2016 1:23 AM

## 2016-10-26 NOTE — Lactation Note (Signed)
This note was copied from a baby's chart. Lactation Consultation Note  Patient Name: Girl Maylene RoesMarkisa Birchmeier WUJWJ'XToday's Date: 10/26/2016 Reason for consult: Initial assessment Baby at 1 hr of life. Upon entry baby was latched to the L breast in cradle. Mom stated latch was feeling "pinchy" as we talked. Showed her how to take baby off, burp baby, and latch her in cross cradle to the R breast. Baby has a nice gape but showed some uncoordinated tongue movement. After a few tries she was able to latch and maintain a short burst of sucking. Mom has soft breast with short nipples. Mom was sleepy when talking about bf, she may need more education. Mom's Aunt is in the room. She is supportive of bf and asked a lot questions. Discussed baby behavior, feeding frequency, baby belly size, voids, wt loss, breast changes, and nipple care. Demonstrated manual expression, reviewed spoon feeding. Given lactation handouts. Aware of OP services and support group.      Maternal Data Has patient been taught Hand Expression?: Yes Does the patient have breastfeeding experience prior to this delivery?: No  Feeding Feeding Type: Breast Fed Length of feed: 10 min  LATCH Score Latch: Repeated attempts needed to sustain latch, nipple held in mouth throughout feeding, stimulation needed to elicit sucking reflex.  Audible Swallowing: A few with stimulation  Type of Nipple: Everted at rest and after stimulation  Comfort (Breast/Nipple): Soft / non-tender  Hold (Positioning): Full assist, staff holds infant at breast  LATCH Score: 6  Interventions Interventions: Breast feeding basics reviewed;Assisted with latch;Skin to skin;Breast massage;Hand express;Breast compression;Adjust position;Support pillows;Position options;Expressed milk  Lactation Tools Discussed/Used WIC Program: Yes   Consult Status Consult Status: Follow-up Date: 10/27/16 Follow-up type: In-patient    Rulon Eisenmengerlizabeth E Kimo Bancroft 10/26/2016, 3:24  PM

## 2016-10-26 NOTE — Anesthesia Postprocedure Evaluation (Signed)
Anesthesia Post Note  Patient: Kristen [name]  Procedure(s) Performed: * No procedures listed *     Patient location during evaluation: [facility] Anesthesia Type: Epidural Level of consciousness: awake and alert Pain management: pain level controlled Vital Signs Assessment: post-procedure vital signs reviewed and stable Respiratory status: spontaneous breathing Cardiovascular status: blood pressure returned to baseline Postop Assessment: no headache, no backache, epidural receding, adequate PO intake, no signs of nausea or vomiting and patient able to bend at knees Anesthetic complications: no    Last Vitals:  Vitals:   10/26/16 1631 10/26/16 1745  BP: (!) 158/93 (!) 157/97  Pulse: 86 82  Resp:  20  Temp: 37.4 C   SpO2: 100%     Last Pain:  Vitals:   10/26/16 1645  TempSrc:   PainSc: 0-No pain   Pain Goal:                 Kristen [name]

## 2016-10-27 LAB — CBC
HCT: 27 % — ABNORMAL LOW (ref 36.0–46.0)
Hemoglobin: 9.5 g/dL — ABNORMAL LOW (ref 12.0–15.0)
MCH: 28.6 pg (ref 26.0–34.0)
MCHC: 35.2 g/dL (ref 30.0–36.0)
MCV: 81.3 fL (ref 78.0–100.0)
Platelets: 217 10*3/uL (ref 150–400)
RBC: 3.32 MIL/uL — ABNORMAL LOW (ref 3.87–5.11)
RDW: 14 % (ref 11.5–15.5)
WBC: 15.8 10*3/uL — ABNORMAL HIGH (ref 4.0–10.5)

## 2016-10-27 NOTE — Progress Notes (Signed)
Post Partum Day 1 Subjective: Patient is doing well. She denies HA, visual changes, RUQ/epigastric pain. She ambulated without difficulty  Objective: Blood pressure (!) 142/93, pulse 89, temperature 98 F (36.7 C), temperature source Oral, resp. rate 18, height 5\' 4"  (1.626 m), weight 198 lb (89.8 kg), last menstrual period 02/01/2016, SpO2 100 %, unknown if currently breastfeeding.  Physical Exam:  General: alert, cooperative and no distress Lochia: appropriate Uterine Fundus: firm DVT Evaluation: No evidence of DVT seen on physical exam. Negative Homan's sign.   Recent Labs  10/26/16 1509 10/27/16 0512  HGB 10.4* 9.5*  HCT 31.0* 27.0*    Assessment/Plan: 26 yo G1P1 s/p SVD on magnesium sulfate for preeclampsia - Continue magnesium sulfate until 24 hours following deliver - Monitor BP - Continue routine postpartum care   LOS: 2 days   Kristen [name] 10/27/2016, 9:09 AM

## 2016-10-27 NOTE — Lactation Note (Signed)
This note was copied from a baby's chart. Lactation Consultation Note  Patient Name: Kristen Maylene RoesMarkisa [name] [id number]'BToday's Date: 10/27/2016 Reason for consult: Follow-up assessment   Follow-up consult with mom at 6923 hrs old.  Ga 38.2; BW 6 lbs, 1.9 oz.  2% weight loss.  GBS+; +Chlamidia pending results.  Mom is P1 Infant has breastfed x1 (10 min) + attempts x2 (0-3 min) + bottle x5 (9-34 ml) since birth; voids-3; stools-1 since birth 23 hrs ago; LS-5. Mom stated she wanted to breastfeed but was having difficulty getting infant to latch to her nipple (semi-flat, short shafted, needs stimulation to evert). LC offered assistance with latching and mom consented.  Mom has HP in room. Mom stated she did not know how to hand express so LC reviewed hand expressing with mom with return demonstration and observation of colostrum easily expressed.  Several drops expressed and put onto infant's mouth. Infant showing feeding cues; Reviewed feeding cues.  Reviewed supply and demand and importance to breastfeed to build milk supply. Mom put infant in football hold on left breast.  Manual stimulation to evert nipple.  Taught mom how to sandwich and support weight of breast (large soft breast).  LC assisted with teacup hold.   Infant attempted to latch several times but would take a few sucks and then come off.  LC suggested may need to try nipple shield if infant continues having difficulty since infant has had several bottles.  Mom wanted to continue to try without nipple shield.  Mom pre-pumped with HP to help evert nipple.  After several more attempts, infant latched and stayed latch in a consistent sucking pattern for several minutes.   LC taught FOB how to assist at breast with teacup hold and giving support.  FOB supportive and assisting with latching. [facility] Encouraged mom to call for assistance with latching if needed.       Maternal Data    Feeding    LATCH Score                    Interventions    Lactation Tools Discussed/Used     Consult Status Consult Status: Follow-up Date: 10/28/16 Follow-up type: In-patient    Lendon KaVann, Claudell Wohler Walker 10/27/2016, 1:32 PM

## 2016-10-28 MED ORDER — IBUPROFEN 600 MG PO TABS: 600.0000 mg | ORAL_TABLET | Freq: Four times a day (QID) | ORAL | 0 refills | Status: DC

## 2016-10-28 NOTE — Discharge Summary (Signed)
OB Discharge Summary     Patient Name: Kristen [name] DOB: [birth year] MRN: [id number]  Date of admission: 10/25/2016 Delivering MD: KEYJearld Lesch, MARY K   Date of discharge: 10/28/2016  Admitting diagnosis: INDUCTION Intrauterine pregnancy: 9852w2d     Secondary diagnosis:  Active Problems:   PIH (pregnancy induced hypertension)  Additional problems: preeclampsia     Discharge diagnosis: Term Pregnancy Delivered and Preeclampsia (severe)                                                                                                 Augmentation: Cytotec and Foley Balloon  Complications: None  Hospital course:  Onset of Labor With Vaginal Delivery     26 y.o. yo G1P1001 at 4152w2d was admitted in Latent Labor on 10/25/2016. Patient had an uncomplicated labor course as follows:  Membrane Rupture Time/Date: 10:42 PM ,10/25/2016   Intrapartum Procedures: Episiotomy: None [1]                                         Lacerations:  1st degree [2]  Patient had a delivery of a Viable infant. 10/26/2016  Information for the patient's newborn:  Kristen [name], Girl Kristen [name] [[id number]][[id number]]  Delivery Method: Vag-Spont    Pateint had an uncomplicated postpartum course.  She remained on magnesium sulfate for seizure for prophylaxis for 24 hours. She is ambulating, tolerating a regular diet, passing flatus, and urinating well. Patient is discharged home in stable condition on 10/28/16.   Physical exam  Vitals:   10/27/16 2117 10/27/16 2321 10/28/16 0400 10/28/16 0802  BP: 132/88 126/84 136/86 137/75  Pulse: 88 88 84 83  Resp: 19 18 18 18   Temp: (!) 97.3 F (36.3 C) 98.5 F (36.9 C) 99 F (37.2 C) 98 F (36.7 C)  TempSrc: Oral Oral Oral Oral  SpO2: 100% 100% 99% 99%  Weight:      Height:       General: alert, cooperative and no distress Lochia: appropriate Uterine Fundus: firm DVT Evaluation: No evidence of DVT seen on physical exam. Negative Homan's sign. Labs: Lab Results  Component Value Date   WBC  15.8 (H) 10/27/2016   HGB 9.5 (L) 10/27/2016   HCT 27.0 (L) 10/27/2016   MCV 81.3 10/27/2016   PLT 217 10/27/2016   CMP Latest Ref Rng & Units 10/25/2016  Glucose 65 - 99 mg/dL 73  BUN 6 - 20 mg/dL 7  Creatinine 7.820.44 - 9.561.00 mg/dL 2.130.86  Sodium 086135 - 578145 mmol/L 136  Potassium 3.5 - 5.1 mmol/L 3.9  Chloride 101 - 111 mmol/L 106  CO2 22 - 32 mmol/L 20(L)  Calcium 8.9 - 10.3 mg/dL 9.1  Total Protein 6.5 - 8.1 g/dL 6.5  Total Bilirubin 0.3 - 1.2 mg/dL 4.6(N0.2(L)  Alkaline Phos 38 - 126 U/L 165(H)  AST 15 - 41 U/L 20  ALT 14 - 54 U/L 11(L)    Discharge instruction: per After Visit Summary and "Baby and Me Booklet".  After visit meds:  Allergies as  of 10/28/2016   No Known Allergies     Medication List    STOP taking these medications   calcium carbonate 500 MG chewable tablet Commonly known as:  TUMS - dosed in mg elemental calcium   docusate sodium 100 MG capsule Commonly known as:  COLACE   famotidine 20 MG tablet Commonly known as:  PEPCID     TAKE these medications   ferrous sulfate 325 (65 FE) MG tablet Commonly known as:  FERROUSUL Take 1 tablet (325 mg total) by mouth 2 (two) times daily.   ibuprofen 600 MG tablet Commonly known as:  ADVIL,MOTRIN Take 1 tablet (600 mg total) by mouth every 6 (six) hours.   multivitamin-prenatal 27-0.8 MG Tabs tablet Take 1 tablet by mouth daily at 12 noon.            Discharge Care Instructions        Start     Ordered   10/28/16 0000  ibuprofen (ADVIL,MOTRIN) 600 MG tablet  Every 6 hours     10/28/16 0756   10/25/16 0000  OB RESULT CONSOLE Group B Strep    Comments:  This external order was created through the Results Console.   10/25/16 1425   10/25/16 0000  OB RESULTS CONSOLE GC/Chlamydia    Comments:  This external order was created through the Results Console.   10/25/16 1928      Diet: routine diet  Activity: Advance as tolerated. Pelvic rest for 6 weeks.    Follow up Appt: Future Appointments Date  Time Provider Department Center  12/01/2016 10:00 AM Levie Heritage, DO [facility] None   Follow up Visit:No Follow-up on file.  Postpartum contraception: Depo Provera  Newborn Data: Live born female  Birth Weight: 6 lb 1.9 oz (2775 g) APGAR: 8, 9  Baby Feeding: Breast Disposition:home with mother   10/28/2016 Catalina Antigua, MD

## 2016-10-28 NOTE — Lactation Note (Signed)
This note was copied from a baby's chart. Lactation Consultation Note  Patient Name: Kristen Kristen RoesMarkisa Soden ONGEX'BToday's Date: 10/28/2016   Visited with Mom on day of discharge, baby 6444 hrs old. Baby at 6% weight loss.  Mom putting baby in car seat, getting ready to leave.  Mom is breastfeeding, and supplementing baby with formula by bottle (per her choice).  [facility]  Talked to her about benefits of double pumping.  Mom states she has a manual pump.  She is choosing to formula feed along with breast feed, so doesn't want to obtain a WIC pump, as she would like formula vouchers.  Explained that a 2 week loaner might be all she needs to help establish her milk supply.  Mom aware of OP lactation appointments available, LC recommended she make an appointment.  Mom aware of Breastfeeding Support Group on Tuesday. Mom to feed baby at least every 3 hrs, sooner if she acts hungry.  Mom states she isn't getting much milk when she tries to express milk.  Talked about engorgement prevention and treatment.   To Pediatrician tomorrow. Mom to call prn for concerns regarding breastfeeding.  Kristen [name], Kristen [name] 10/28/2016, 11:02 AM

## 2016-10-28 NOTE — Progress Notes (Signed)
Patient discharged home with infant and significant other. discharge teaching, home care, prescriptions, and follow-up appts reviewed. Signs/symptoms of preeclampsia reviewed. No questions at this time.

## 2016-11-27 ENCOUNTER — Encounter: Payer: Self-pay | Admitting: Family Medicine

## 2016-12-01 ENCOUNTER — Ambulatory Visit (INDEPENDENT_AMBULATORY_CARE_PROVIDER_SITE_OTHER): Payer: Medicaid Other | Admitting: Family Medicine

## 2016-12-01 MED ORDER — LO LOESTRIN FE 1 MG-10 MCG / 10 MCG PO TABS: 1.0000 | ORAL_TABLET | Freq: Every day | ORAL | 3 refills | Status: DC

## 2016-12-01 NOTE — Progress Notes (Signed)
Post Partum Exam  Kristen [name] is a 26 y.o. G19P1001 female who presents for a postpartum visit. She is 5 week postpartum following a spontaneous vaginal delivery. I have fully reviewed the prenatal and intrapartum course. The delivery was at 39 gestational weeks.  Anesthesia: epidural. Postpartum course has been uneventful. Baby's course has been uneventful. Baby is feeding by bottle - Carn[employer] Good Start. Bleeding no bleeding. Bowel function is normal. Bladder function is normal. Patient is sexually active. Contraception method is OCP (estrogen/progesterone). Postpartum depression screening:neg  The following portions of the patient's history were reviewed and updated as appropriate: allergies, current medications, past family history, past medical history, past social history, past surgical history and problem list.  Review of Systems Pertinent items are noted in HPI.    Objective:  unknown if currently breastfeeding.  General:  alert, cooperative and no distress  Lungs: clear to auscultation bilaterally  Heart:  regular rate and rhythm, S1, S2 normal, no murmur, click, rub or gallop  Abdomen: soft, non-tender; bowel sounds normal; no masses,  no organomegaly   Vulva:  normal  Vagina: normal vagina, no discharge, exudate, lesion, or erythema        Assessment:    Normal postpartum exam. Pap smear not done at today's visit.   Plan:   1. Contraception: OCP (estrogen/progesterone) 2. Follow up in: 1 year or as needed.

## 2016-12-01 NOTE — Addendum Note (Signed)
Addended by: Anell Barr on: 12/01/2016 12:07 PM   Modules accepted: Orders

## 2018-02-13 ENCOUNTER — Other Ambulatory Visit: Payer: Self-pay

## 2018-02-13 ENCOUNTER — Emergency Department (HOSPITAL_COMMUNITY)
Admission: EM | Admit: 2018-02-13 | Discharge: 2018-02-14 | Disposition: A | Discharge status: Home / Self Care | Payer: Medicaid Other | Attending: Emergency Medicine | Admitting: Emergency Medicine

## 2018-02-13 ENCOUNTER — Encounter (HOSPITAL_COMMUNITY): Payer: Self-pay | Admitting: Emergency Medicine

## 2018-02-13 DIAGNOSIS — Z79899 Other long term (current) drug therapy: Secondary | ICD-10-CM | POA: Insufficient documentation

## 2018-02-13 DIAGNOSIS — J039 Acute tonsillitis, unspecified: Secondary | ICD-10-CM | POA: Insufficient documentation

## 2018-02-13 LAB — GROUP A STREP BY PCR: Group A Strep by PCR: NOT DETECTED

## 2018-02-13 NOTE — ED Triage Notes (Signed)
Patient c/o sore throat, cough and congestion x2 days.

## 2018-02-14 MED ORDER — DEXAMETHASONE SODIUM PHOSPHATE 10 MG/ML IJ SOLN
10.0000 mg | Freq: Once | INTRAMUSCULAR | Status: AC
  Administered 2018-02-14: 10 mg via INTRAMUSCULAR
  Filled 2018-02-14: qty 1

## 2018-02-14 MED ORDER — AMOXICILLIN 400 MG/5ML PO SUSR: 1000.0000 mg | Freq: Two times a day (BID) | ORAL | 0 refills | Status: AC

## 2018-02-14 MED ORDER — AMOXICILLIN 250 MG/5ML PO SUSR
1000.0000 mg | Freq: Once | ORAL | Status: AC
  Administered 2018-02-14: 1000 mg via ORAL
  Filled 2018-02-14: qty 20

## 2018-02-14 NOTE — Discharge Instructions (Addendum)
Take Tylenol and/or ibuprofen to control fever and pain. Take the antibiotic as directed until gone. It is important to complete the prescription as directed. Return here with any worsening symptoms or new concerns.

## 2018-02-14 NOTE — ED Provider Notes (Signed)
[facility]-EMERGENCY DEPT Provider Note   CSN: [id number] Arrival date & time: 02/13/18  2051     History   Chief Complaint Chief Complaint  Patient presents with  . Sore Throat    HPI Kristen [name] is a 27 y.o. female.  Patient to ED with severe sore throat that started about 4 days ago and has progressively gotten worse, associated with fever with Tmax 102 at home, congestion and mild nausea. No significant productive cough. No known sick contacts. She has a history of strep throat and states current symptoms feel the same.    Sore Throat  Pertinent negatives include no shortness of breath.    Past Medical History:  Diagnosis Date  . Bacterial vaginitis 07/01/2016  . Medical history non-contributory     Patient Active Problem List   Diagnosis Date Noted  . Sickle cell trait (HCC) 04/26/2016    Past Surgical History:  Procedure Laterality Date  . NO PAST SURGERIES       OB History    Gravida  1   Para  1   Term  1   Preterm  0   AB  0   Living  1     SAB  0   TAB  0   Ectopic  0   Multiple  0   Live Births  1            Home Medications    Prior to Admission medications   Medication Sig Start Date End Date Taking? Authorizing Provider  ferrous sulfate (FERROUSUL) 325 (65 FE) MG tablet Take 1 tablet (325 mg total) by mouth 2 (two) times daily. Patient not taking: Reported on 10/25/2016 09/02/16   Levie Heritage, DO  ibuprofen (ADVIL,MOTRIN) 600 MG tablet Take 1 tablet (600 mg total) by mouth every 6 (six) hours. 10/28/16   Constant, Peggy, MD  LO LOESTRIN FE 1 MG-10 MCG / 10 MCG tablet Take 1 tablet by mouth daily. 12/01/16   Levie Heritage, DO  Prenatal Vit-Fe Fumarate-FA (MULTIVITAMIN-PRENATAL) 27-0.8 MG TABS tablet Take 1 tablet by mouth daily at 12 noon.    [provider]    Family History Family History  Problem Relation Age of Onset  . Heart disease Father     Social History Social History    Tobacco Use  . Smoking status: Never Smoker  . Smokeless tobacco: Never Used  Substance Use Topics  . Alcohol use: Yes    Comment: occ  . Drug use: No     Allergies   Patient has no known allergies.   Review of Systems Review of Systems  Constitutional: Negative for chills and fever.  HENT: Positive for congestion, sore throat and trouble swallowing (Painful).   Respiratory: Positive for cough. Negative for shortness of breath.   Cardiovascular: Negative.   Gastrointestinal: Positive for nausea. Negative for vomiting.  Musculoskeletal: Negative.  Negative for neck stiffness.  Skin: Negative.  Negative for rash.  Neurological: Negative.      Physical Exam Updated Vital Signs BP 140/87 (BP Location: Left Arm)   Pulse (!) 108   Temp 99.7 F (37.6 C) (Oral)   Resp (!) 21   LMP 01/24/2018   SpO2 96%   Physical Exam Constitutional:      Appearance: She is well-developed. She is ill-appearing (Nontoxic).  HENT:     Head: Normocephalic.     Mouth/Throat:     Mouth: Mucous membranes are moist.  Pharynx: Uvula midline.     Tonsils: Tonsillar exudate present. Swelling: 2+ on the right. 1+ on the left.  Eyes:     Conjunctiva/sclera: Conjunctivae normal.  Neck:     Musculoskeletal: Normal range of motion and neck supple.  Cardiovascular:     Rate and Rhythm: Normal rate and regular rhythm.  Pulmonary:     Effort: Pulmonary effort is normal.     Breath sounds: Normal breath sounds. No wheezing, rhonchi or rales.  Abdominal:     Palpations: Abdomen is soft.     Tenderness: There is no abdominal tenderness. There is no guarding or rebound.  Musculoskeletal: Normal range of motion.  Skin:    General: Skin is warm and dry.     Findings: No rash.  Neurological:     Mental Status: She is alert.     Cranial Nerves: No cranial nerve deficit.      ED Treatments / Results  Labs (all labs ordered are listed, but only abnormal results are displayed) Labs Reviewed   GROUP A STREP BY PCR    EKG None  Radiology No results found.  Procedures Procedures (including critical care time)  Medications Ordered in ED Medications - No data to display   Initial Impression / Assessment and Plan / ED Course  I have reviewed the triage vital signs and the nursing notes.  Pertinent labs & imaging results that were available during my care of the patient were reviewed by me and considered in my medical decision making (see chart for details).     Patient to ED primarily with sore throat, also with congestion, fever, nausea. History of strep.   Exam finds significantly swollen tonsils with large quantity exudates, R>L tonsil. No peritonsilar abscess visualized. Uvula midline. She is managing her secretions.   Strep test done and shows negative, however, given the patient clinical exam, will treat with Amoxil. Single dose decadron provided here for swelling. She can be discharged home with other supportive measures. Return precautions discussed.   Final Clinical Impressions(s) / ED Diagnoses   Final diagnoses:  None   1. Exudative tonsillitis   ED Discharge Orders    None       Elpidio AnisUpstill, Brithney Bensen, Cordelia Poche-C 02/14/18 0032    Dione BoozeGlick, David, MD 02/14/18 [phone]

## 2019-02-18 ENCOUNTER — Ambulatory Visit (INDEPENDENT_AMBULATORY_CARE_PROVIDER_SITE_OTHER): Payer: Medicaid Other

## 2019-02-18 ENCOUNTER — Other Ambulatory Visit: Payer: Self-pay

## 2019-02-18 DIAGNOSIS — Z3201 Encounter for pregnancy test, result positive: Secondary | ICD-10-CM

## 2019-02-18 DIAGNOSIS — N912 Amenorrhea, unspecified: Secondary | ICD-10-CM

## 2019-02-18 LAB — POCT URINE PREGNANCY: Preg Test, Ur: POSITIVE — AB

## 2019-02-18 NOTE — Progress Notes (Signed)
Patient presents for UPT and pregnancy verification letter. Patient had postive UPT in office and  Given letter. LMP 01-11-19. EDC based off LMP is August 22,2021. Patient set up for New OB in a few weeks and by her LMP is 5w 3 days today. Kathrene Alu RN

## 2019-02-19 NOTE — Progress Notes (Signed)
Chart reviewed - agree with RN documentation.   

## 2019-02-20 ENCOUNTER — Telehealth: Payer: Self-pay

## 2019-02-20 NOTE — Telephone Encounter (Signed)
Pt sent MyChart message stating she noticed a little blood when she wiped this morning. Explained to pt that spotting early in pregnancy is normal. Pt advised to go to [facility] at [facility] if she starts bleeding heavy like a period. Understanding was voiced.  Derrin Currey l Tiwan Schnitker, CMA

## 2019-03-01 NOTE — L&D Delivery Note (Signed)
Delivery Note At 10:00 PM a viable female was delivered via Vaginal, Spontaneous (Presentation:   Occiput Anterior).  APGAR: 7;9 weight pending.   Placenta status: Spontaneous, Intact.  Cord: 3 vessels with the following complications: None.   Anesthesia: Epidural Episiotomy: None Lacerations: 1st degree Suture Repair: 2.0vicryl Est. Blood Loss (mL): 50  Mom to postpartum.  Baby to Couplet care / Skin to Skin.  Delivery: Called to room and patient was complete and pushing. Head delivered direct OA. Nuchal cord present x1. Shoulder and body delivered in usual fashion. Infant with spontaneous cry, placed on mother's abdomen, dried and stimulated. Cord clamped x 2 after 1-minute delay, and cut by FOB under my direct supervision. Cord blood drawn. Placenta delivered spontaneously with gentle cord traction. Fundus firm with massage and Pitocin. Labia, perineum, vagina, and cervix were inspected, as noted above.   Alric Seton 10/15/2019, 10:18 PM

## 2019-03-14 ENCOUNTER — Other Ambulatory Visit: Payer: Self-pay

## 2019-03-14 ENCOUNTER — Other Ambulatory Visit (HOSPITAL_COMMUNITY)
Admission: RE | Admit: 2019-03-14 | Discharge: 2019-03-14 | Disposition: A | Discharge status: Home / Self Care | Payer: Medicaid Other | Source: Ambulatory Visit | Attending: Family Medicine | Admitting: Family Medicine

## 2019-03-14 ENCOUNTER — Ambulatory Visit (INDEPENDENT_AMBULATORY_CARE_PROVIDER_SITE_OTHER): Payer: Medicaid Other | Admitting: Family Medicine

## 2019-03-14 ENCOUNTER — Encounter: Payer: Self-pay | Admitting: Family Medicine

## 2019-03-14 DIAGNOSIS — Z3480 Encounter for supervision of other normal pregnancy, unspecified trimester: Secondary | ICD-10-CM | POA: Diagnosis not present

## 2019-03-14 DIAGNOSIS — Z3481 Encounter for supervision of other normal pregnancy, first trimester: Secondary | ICD-10-CM | POA: Diagnosis not present

## 2019-03-14 DIAGNOSIS — Z3A08 8 weeks gestation of pregnancy: Secondary | ICD-10-CM

## 2019-03-14 DIAGNOSIS — Z362 Encounter for other antenatal screening follow-up: Secondary | ICD-10-CM | POA: Diagnosis not present

## 2019-03-14 MED ORDER — ONDANSETRON 4 MG PO TBDP: 4.0000 mg | ORAL_TABLET | Freq: Four times a day (QID) | ORAL | 0 refills | Status: DC | PRN

## 2019-03-14 MED ORDER — AMBULATORY NON FORMULARY MEDICATION: 1.0000 | 0 refills | Status: DC

## 2019-03-14 NOTE — Progress Notes (Signed)
DATING AND VIABILITY SONOGRAM   Kristen [name] is a 29 y.o. year old G2P1001 with LMP Patient's last menstrual period was 01/12/2019 (exact date). which would correlate to  [redacted]w[redacted]d weeks gestation.  She has regular menstrual cycles.   She is here today for a confirmatory initial sonogram.    GESTATION: SINGLETON    FETAL ACTIVITY:          Heart rate        186          The fetus is active.     GESTATIONAL AGE AND  BIOMETRICS:  Gestational criteria: Estimated Date of Delivery: 10/19/19 by LMP now at [redacted]w[redacted]d  Previous Scans:0      CROWN RUMP LENGTH           1.90 cm         8-3 weeks                                                                               AVERAGE EGA(BY THIS SCAN):  8-3 weeks  WORKING EDD( LMP ):  10/19/19     TECHNICIAN COMMENTS: Patient informed that the ultrasound is considered a limited obstetric ultrasound and is not intended to be a complete ultrasound exam. Patient also informed that the ultrasound is not being completed with the intent of assessing for fetal or placental anomalies or any pelvic abnormalities. Explained that the purpose of today's ultrasound is to assess for fetal heart rate. Patient acknowledges the purpose of the exam and the limitations of the study.    Kristen [name] 03/14/2019 3:13 PM

## 2019-03-14 NOTE — Progress Notes (Signed)
  Subjective:  Kristen [name] is a G2P1001 70w5dbeing seen today for her first obstetrical visit.  Her obstetrical history is significant for previous vaginal delivery.. Patient does intend to breast feed. Pregnancy history fully reviewed.  Patient reports nausea.  BP 101/60   Pulse 100   Wt 226 lb (102.5 kg)   LMP 01/12/2019   BMI 38.79 kg/m   HISTORY: OB History  Gravida Para Term Preterm AB Living  _0 0 0 1  SAB TAB Ectopic Multiple Live Births  0 0 0 0 1    # Outcome Date GA Lbr Len/2nd Weight Sex Delivery Anes PTL Lv  2 Current           1 Term 10/26/16 329w2d3:48 / 01:48 6 lb 1.9 oz (2.775 kg) F Vag-Spont EPI  LIV    Past Medical History:  Diagnosis Date  . Bacterial vaginitis 07/01/2016  . Medical history non-contributory     Past Surgical History:  Procedure Laterality Date  . NO PAST SURGERIES      Family History  Problem Relation Age of Onset  . Heart disease Father      Exam  BP 101/60   Pulse 100   Wt 226 lb (102.5 kg)   LMP 01/12/2019   BMI 38.79 kg/m   Chaperone present during exam  CONSTITUTIONAL: Well-developed, well-nourished female in no acute distress.  HENT:  Normocephalic, atraumatic, External right and left ear normal. Oropharynx is clear and moist EYES: Conjunctivae and EOM are normal. Pupils are equal, round, and reactive to light. No scleral icterus.  NECK: Normal range of motion, supple, no masses.  Normal thyroid.  CARDIOVASCULAR: Normal heart rate noted, regular rhythm RESPIRATORY: Clear to auscultation bilaterally. Effort and breath sounds normal, no problems with respiration noted. BREASTS: Symmetric in size. No masses, skin changes, nipple drainage, or lymphadenopathy. ABDOMEN: Soft, normal bowel sounds, no distention noted.  No tenderness, rebound or guarding.  PELVIC: Normal appearing external genitalia; normal appearing vaginal mucosa and cervix. No abnormal discharge noted. Normal uterine size, no other palpable masses, no  uterine or adnexal tenderness. MUSCULOSKELETAL: Normal range of motion. No tenderness.  No cyanosis, clubbing, or edema.  2+ distal pulses. SKIN: Skin is warm and dry. No rash noted. Not diaphoretic. No erythema. No pallor. NEUROLOGIC: Alert and oriented to person, place, and time. Normal reflexes, muscle tone coordination. No cranial nerve deficit noted. PSYCHIATRIC: Normal mood and affect. Normal behavior. Normal judgment and thought content.    Assessment:    Pregnancy: G2P1001 Patient Active Problem List   Diagnosis Date Noted  . Supervision of other normal pregnancy, antepartum 03/14/2019  . Sickle cell trait ([place]crest02/27/2018      Plan:   1. Supervision of other normal pregnancy, antepartum FHT normal. Check HgA1c Declines genetic testing.  - HgB A1c - Culture, OB Urine - Obstetric Panel, Including HIV - SMN1 COPY NUMBER ANALYSIS (SMA Carrier Screen) - Cytology - PAP( [facility]) - CHL AMB BABYSCRIPTS SCHEDULE OPTIMIZATION - AMBULATORY NON FORMULARY MEDICATION; 1 Device by Other route once a week. Blood Pressure Cuff/Large Monitored Regularly at home ICD 10: Z34.90 LROB  Dispense: 1 kit; Refill: 0     Problem list reviewed and updated. 75% of 30 min visit spent on counseling and coordination of care.     JaTruett Mainland/14/2021

## 2019-03-15 LAB — OBSTETRIC PANEL, INCLUDING HIV
Antibody Screen: NEGATIVE
Basophils Absolute: 0 10*3/uL (ref 0.0–0.2)
Basos: 0 %
EOS (ABSOLUTE): 0.1 10*3/uL (ref 0.0–0.4)
Eos: 1 %
HIV Screen 4th Generation wRfx: NONREACTIVE
Hematocrit: 39 % (ref 34.0–46.6)
Hemoglobin: 13.1 g/dL (ref 11.1–15.9)
Hepatitis B Surface Ag: NEGATIVE
Immature Grans (Abs): 0 10*3/uL (ref 0.0–0.1)
Immature Granulocytes: 0 %
Lymphocytes Absolute: 2.4 10*3/uL (ref 0.7–3.1)
Lymphs: 25 %
MCH: 29.4 pg (ref 26.6–33.0)
MCHC: 33.6 g/dL (ref 31.5–35.7)
MCV: 87 fL (ref 79–97)
Monocytes Absolute: 0.6 10*3/uL (ref 0.1–0.9)
Monocytes: 7 %
Neutrophils Absolute: 6.3 10*3/uL (ref 1.4–7.0)
Neutrophils: 67 %
Platelets: 344 10*3/uL (ref 150–450)
RBC: 4.46 x10E6/uL (ref 3.77–5.28)
RDW: 12.7 % (ref 11.7–15.4)
RPR Ser Ql: NONREACTIVE
Rh Factor: POSITIVE
Rubella Antibodies, IGG: 4.27 index (ref >0.99)
WBC: 9.4 10*3/uL (ref 3.4–10.8)

## 2019-03-15 LAB — HEMOGLOBIN A1C
Est. average glucose Bld gHb Est-mCnc: 108 mg/dL
Hgb A1c MFr Bld: 5.4 % (ref 4.8–5.6)

## 2019-03-15 NOTE — Addendum Note (Signed)
Addended by: Levie Heritage on: 03/15/2019 10:45 AM   Modules accepted: Orders

## 2019-03-16 LAB — CULTURE, OB URINE

## 2019-03-16 LAB — URINE CULTURE, OB REFLEX

## 2019-03-18 LAB — CYTOLOGY - PAP
Chlamydia: NEGATIVE
Comment: NEGATIVE
Comment: NORMAL
Diagnosis: NEGATIVE
Neisseria Gonorrhea: NEGATIVE

## 2019-03-26 LAB — SMN1 COPY NUMBER ANALYSIS (SMA CARRIER SCREENING)

## 2019-04-03 ENCOUNTER — Ambulatory Visit (INDEPENDENT_AMBULATORY_CARE_PROVIDER_SITE_OTHER): Payer: Medicaid Other

## 2019-04-03 ENCOUNTER — Other Ambulatory Visit: Payer: Self-pay

## 2019-04-03 DIAGNOSIS — Z3480 Encounter for supervision of other normal pregnancy, unspecified trimester: Secondary | ICD-10-CM

## 2019-04-04 NOTE — Progress Notes (Signed)
Pt presents to the office for Panorama.  Shawnee Higham l Kirtan Sada, CMA

## 2019-04-16 ENCOUNTER — Encounter: Payer: Self-pay | Admitting: Advanced Practice Midwife

## 2019-04-16 ENCOUNTER — Telehealth (INDEPENDENT_AMBULATORY_CARE_PROVIDER_SITE_OTHER): Payer: Medicaid Other | Admitting: Advanced Practice Midwife

## 2019-04-16 DIAGNOSIS — Z3480 Encounter for supervision of other normal pregnancy, unspecified trimester: Secondary | ICD-10-CM | POA: Diagnosis not present

## 2019-04-16 NOTE — Progress Notes (Signed)
I connected with patient on 04/16/19 at 10:30 AM EST by: MyChartVideo and verified that I am speaking with the correct person using two identifiers.  Patient is located at home and provider is located at [facility] office.     The purpose of this virtual visit is to provide medical care while limiting exposure to the novel coronavirus. I discussed the limitations, risks, security and privacy concerns of performing an evaluation and management service by me and the availability of in person appointments. I also discussed with the patient that there may be a patient responsible charge related to this service. By engaging in this virtual visit, you consent to the provision of healthcare.  Additionally, you authorize for your insurance to be billed for the services provided during this visit.  The patient expressed understanding and agreed to proceed.  The following staff members participated in the virtual visit:  Lorelle Gibbs CMA    PRENATAL VISIT NOTE  Subjective:  Kristen [name] is a 29 y.o. G2P1001 at [redacted]w[redacted]d  for phone visit for ongoing prenatal care.  She is currently monitored for the following issues for this low-risk pregnancy and has Sickle cell trait (HCC) and Supervision of other normal pregnancy, antepartum on their problem list.  Patient reports no complaints.   .  .   . Nausea better  Denies leaking of fluid.   The following portions of the patient's history were reviewed and updated as appropriate: allergies, current medications, past family history, past medical history, past social history, past surgical history and problem list.   Objective:  There were no vitals filed for this visit. Self-Obtained  Fetal Status:           Assessment and Plan:  Pregnancy: G2P1001 at [redacted]w[redacted]d 1. Supervision of other normal pregnancy, antepartum Reviewed normal growth and development Reviewed normal low risk Female result on Panorama -- pt is thrilled  Preterm labor symptoms and general obstetric  precautions including but not limited to vaginal bleeding, contractions, leaking of fluid and fetal movement were reviewed in detail with the patient.   Time spent on virtual visit: 10 minutes  Wynelle Bourgeois, CNM

## 2019-04-16 NOTE — Patient Instructions (Signed)

## 2019-05-23 ENCOUNTER — Ambulatory Visit (INDEPENDENT_AMBULATORY_CARE_PROVIDER_SITE_OTHER): Payer: Medicaid Other | Admitting: Family Medicine

## 2019-05-23 ENCOUNTER — Other Ambulatory Visit: Payer: Self-pay

## 2019-05-23 VITALS — BP 125/75 | HR 96 | Wt 240.1 lb

## 2019-05-23 DIAGNOSIS — Z3A18 18 weeks gestation of pregnancy: Secondary | ICD-10-CM

## 2019-05-23 DIAGNOSIS — Z3482 Encounter for supervision of other normal pregnancy, second trimester: Secondary | ICD-10-CM

## 2019-05-23 DIAGNOSIS — D573 Sickle-cell trait: Secondary | ICD-10-CM

## 2019-05-23 DIAGNOSIS — Z3480 Encounter for supervision of other normal pregnancy, unspecified trimester: Secondary | ICD-10-CM

## 2019-05-23 MED ORDER — FAMOTIDINE 20 MG PO TABS: 20.0000 mg | ORAL_TABLET | Freq: Two times a day (BID) | ORAL | 3 refills | Status: DC

## 2019-05-23 NOTE — Progress Notes (Signed)
   PRENATAL VISIT NOTE  Subjective:  Kristen [name] is a 29 y.o. G2P1001 at [redacted]w[redacted]d being seen today for ongoing prenatal care.  She is currently monitored for the following issues for this low-risk pregnancy and has Sickle cell trait (HCC) and Supervision of other normal pregnancy, antepartum on their problem list.  Patient reports heartburn - tums not fully effective.  Contractions: Not present. Vag. Bleeding: None.  Movement: Absent. Denies leaking of fluid.   The following portions of the patient's history were reviewed and updated as appropriate: allergies, current medications, past family history, past medical history, past social history, past surgical history and problem list.   Objective:   Vitals:   05/23/19 0954  BP: 125/75  Pulse: 96  Weight: 240 lb 1.9 oz (108.9 kg)    Fetal Status: Fetal Heart Rate (bpm): 141   Movement: Absent     General:  Alert, oriented and cooperative. Patient is in no acute distress.  Skin: Skin is warm and dry. No rash noted.   Cardiovascular: Normal heart rate noted  Respiratory: Normal respiratory effort, no problems with respiration noted  Abdomen: Soft, gravid, appropriate for gestational age.  Pain/Pressure: Present     Pelvic: Cervical exam deferred        Extremities: Normal range of motion.  Edema: None  Mental Status: Normal mood and affect. Normal behavior. Normal judgment and thought content.   Assessment and Plan:  Pregnancy: G2P1001 at [redacted]w[redacted]d 1. Supervision of other normal pregnancy, antepartum FHT and FH normal. Pepcid for heartburn. Needs note for work to sit every 2 hours and to avoid being on feet constantly. - AFP, Serum, Open Spina Bifida - [place] MFM OB COMP + 14 WK; Future  Preterm labor symptoms and general obstetric precautions including but not limited to vaginal bleeding, contractions, leaking of fluid and fetal movement were reviewed in detail with the patient. Please refer to After Visit Summary for other counseling  recommendations.   No follow-ups on file.  Future Appointments  Date Time Provider Department Center  06/20/2019  9:45 AM Levie Heritage, DO [facility] None  07/18/2019  9:45 AM Levie Heritage, DO [facility] None    Levie Heritage, DO

## 2019-05-28 LAB — AFP, SERUM, OPEN SPINA BIFIDA
AFP MoM: 0.95
AFP Value: 38.7 ng/mL
Gest. Age on Collection Date: 18.7 weeks
Maternal Age At EDD: 27 yr
OSBR Risk 1 IN: 10000
Test Results:: NEGATIVE
Weight: 240 [lb_av]

## 2019-06-04 ENCOUNTER — Other Ambulatory Visit: Payer: Self-pay | Admitting: Family Medicine

## 2019-06-04 ENCOUNTER — Ambulatory Visit (HOSPITAL_COMMUNITY)
Admission: RE | Admit: 2019-06-04 | Discharge: 2019-06-04 | Disposition: A | Discharge status: Home / Self Care | Payer: Medicaid Other | Source: Ambulatory Visit | Attending: Obstetrics and Gynecology | Admitting: Obstetrics and Gynecology

## 2019-06-04 ENCOUNTER — Other Ambulatory Visit: Payer: Self-pay

## 2019-06-04 DIAGNOSIS — Z3A20 20 weeks gestation of pregnancy: Secondary | ICD-10-CM | POA: Diagnosis not present

## 2019-06-04 DIAGNOSIS — Z3480 Encounter for supervision of other normal pregnancy, unspecified trimester: Secondary | ICD-10-CM | POA: Diagnosis not present

## 2019-06-04 DIAGNOSIS — Z862 Personal history of diseases of the blood and blood-forming organs and certain disorders involving the immune mechanism: Secondary | ICD-10-CM | POA: Diagnosis not present

## 2019-06-04 DIAGNOSIS — O99212 Obesity complicating pregnancy, second trimester: Secondary | ICD-10-CM

## 2019-06-04 DIAGNOSIS — Z363 Encounter for antenatal screening for malformations: Secondary | ICD-10-CM

## 2019-06-20 ENCOUNTER — Other Ambulatory Visit: Payer: Self-pay

## 2019-06-20 ENCOUNTER — Ambulatory Visit (INDEPENDENT_AMBULATORY_CARE_PROVIDER_SITE_OTHER): Payer: Medicaid Other | Admitting: Family Medicine

## 2019-06-20 VITALS — BP 124/73 | HR 105 | Wt 239.0 lb

## 2019-06-20 DIAGNOSIS — Z3482 Encounter for supervision of other normal pregnancy, second trimester: Secondary | ICD-10-CM

## 2019-06-20 DIAGNOSIS — D573 Sickle-cell trait: Secondary | ICD-10-CM

## 2019-06-20 DIAGNOSIS — Z3480 Encounter for supervision of other normal pregnancy, unspecified trimester: Secondary | ICD-10-CM

## 2019-06-20 DIAGNOSIS — Z3A22 22 weeks gestation of pregnancy: Secondary | ICD-10-CM

## 2019-06-20 NOTE — Progress Notes (Signed)
   PRENATAL VISIT NOTE  Subjective:  Kristen [name] is a 29 y.o. G2P1001 at [redacted]w[redacted]d being seen today for ongoing prenatal care.  She is currently monitored for the following issues for this low-risk pregnancy and has Sickle cell trait (HCC) and Supervision of other normal pregnancy, antepartum on their problem list.  Patient reports no complaints. She reports that the previous heartburn is well controlled with Pepcid and has no barriers to access.  Contractions: Not present. Vag. Bleeding: None.  Movement: Present. Denies leaking of fluid.   The following portions of the patient's history were reviewed and updated as appropriate: allergies, current medications, past family history, past medical history, past social history, past surgical history and problem list.   Objective:   Vitals:   06/20/19 0951  BP: 124/73  Pulse: (!) 105  Weight: 239 lb (108.4 kg)    Fetal Status: Fetal Heart Rate (bpm): 150   Movement: Present     General:  Alert, oriented and cooperative. Patient is in no acute distress.  Skin: Skin is warm and dry. No rash noted.   Cardiovascular: Normal heart rate noted  Respiratory: Normal respiratory effort, no problems with respiration noted  Abdomen: Soft, gravid, appropriate for gestational age.  Pain/Pressure: Present     Pelvic: Cervical exam deferred        Extremities: Normal range of motion.  Edema: None  Mental Status: Normal mood and affect. Normal behavior. Normal judgment and thought content.   Assessment and Plan:  Pregnancy: G2P1001 at [redacted]w[redacted]d 1. Supervision of other normal pregnancy, antepartum FHT and FH normal.  F/U in 4 weeks for routine fasting GTT, blood work labs.   Preterm labor symptoms and general obstetric precautions including but not limited to vaginal bleeding, contractions, leaking of fluid and fetal movement were reviewed in detail with the patient. Please refer to After Visit Summary for other counseling recommendations.   No follow-ups  on file.  Future Appointments  Date Time Provider Department Center  07/18/2019  9:45 AM Levie Heritage, DO [facility] None    Laural Benes, MS3

## 2019-06-20 NOTE — Progress Notes (Signed)
   PRENATAL VISIT NOTE  Subjective:  Kristen [name] is a 29 y.o. G2P1001 at [redacted]w[redacted]d being seen today for ongoing prenatal care.  She is currently monitored for the following issues for this low-risk pregnancy and has Sickle cell trait (HCC) and Supervision of other normal pregnancy, antepartum on their problem list.  Patient reports no complaints.  Contractions: Not present. Vag. Bleeding: None.  Movement: Present. Denies leaking of fluid.   The following portions of the patient's history were reviewed and updated as appropriate: allergies, current medications, past family history, past medical history, past social history, past surgical history and problem list.   Objective:   Vitals:   06/20/19 0951  BP: 124/73  Pulse: (!) 105  Weight: 239 lb (108.4 kg)    Fetal Status: Fetal Heart Rate (bpm): 150   Movement: Present     General:  Alert, oriented and cooperative. Patient is in no acute distress.  Skin: Skin is warm and dry. No rash noted.   Cardiovascular: Normal heart rate noted  Respiratory: Normal respiratory effort, no problems with respiration noted  Abdomen: Soft, gravid, appropriate for gestational age.  Pain/Pressure: Present     Pelvic: Cervical exam deferred        Extremities: Normal range of motion.  Edema: None  Mental Status: Normal mood and affect. Normal behavior. Normal judgment and thought content.   Assessment and Plan:  Pregnancy: G2P1001 at [redacted]w[redacted]d 1. Supervision of other normal pregnancy, antepartum FHT and FH normal. Heartburn controlled  2. Sickle cell trait (HCC)   Preterm labor symptoms and general obstetric precautions including but not limited to vaginal bleeding, contractions, leaking of fluid and fetal movement were reviewed in detail with the patient. Please refer to After Visit Summary for other counseling recommendations.   No follow-ups on file.  Future Appointments  Date Time Provider Department Center  07/18/2019  9:45 AM Levie Heritage,  DO [facility] None    Levie Heritage, DO

## 2019-07-18 ENCOUNTER — Ambulatory Visit (INDEPENDENT_AMBULATORY_CARE_PROVIDER_SITE_OTHER): Payer: Medicaid Other | Admitting: Family Medicine

## 2019-07-18 ENCOUNTER — Other Ambulatory Visit: Payer: Self-pay

## 2019-07-18 VITALS — BP 113/72 | HR 102 | Wt 239.0 lb

## 2019-07-18 DIAGNOSIS — Z3480 Encounter for supervision of other normal pregnancy, unspecified trimester: Secondary | ICD-10-CM

## 2019-07-18 DIAGNOSIS — Z3A26 26 weeks gestation of pregnancy: Secondary | ICD-10-CM

## 2019-07-18 DIAGNOSIS — Z23 Encounter for immunization: Secondary | ICD-10-CM | POA: Diagnosis not present

## 2019-07-18 DIAGNOSIS — O99612 Diseases of the digestive system complicating pregnancy, second trimester: Secondary | ICD-10-CM

## 2019-07-18 DIAGNOSIS — K219 Gastro-esophageal reflux disease without esophagitis: Secondary | ICD-10-CM

## 2019-07-18 NOTE — Progress Notes (Signed)
   PRENATAL VISIT NOTE  Subjective:  Kristen [name] is a 29 y.o. G2P1001 at [redacted]w[redacted]d being seen today for ongoing prenatal care.  She is currently monitored for the following issues for this low-risk pregnancy and has Sickle cell trait (HCC) and Supervision of other normal pregnancy, antepartum on their problem list.  Patient reports no complaints.  Contractions: Not present. Vag. Bleeding: None.  Movement: Present. Denies leaking of fluid.   The following portions of the patient's history were reviewed and updated as appropriate: allergies, current medications, past family history, past medical history, past social history, past surgical history and problem list.   Objective:   Vitals:   07/18/19 0957  BP: 113/72  Pulse: (!) 102  Weight: 239 lb (108.4 kg)    Fetal Status: Fetal Heart Rate (bpm): 142   Movement: Present     General:  Alert, oriented and cooperative. Patient is in no acute distress.  Skin: Skin is warm and dry. No rash noted.   Cardiovascular: Normal heart rate noted  Respiratory: Normal respiratory effort, no problems with respiration noted  Abdomen: Soft, gravid, appropriate for gestational age.  Pain/Pressure: Present     Pelvic: Cervical exam deferred        Extremities: Normal range of motion.  Edema: None  Mental Status: Normal mood and affect. Normal behavior. Normal judgment and thought content.   Assessment and Plan:  Pregnancy: G2P1001 at [redacted]w[redacted]d 1. Supervision of other normal pregnancy, antepartum FHT and FH. - CBC - Glucose Tolerance, 2 Hours w/1 Hour - HIV Antibody (routine testing w rflx) - RPR - Tdap vaccine greater than or equal to 7yo IM  2. Gastroesophageal reflux disease without esophagitis Controlled with pepcid   Preterm labor symptoms and general obstetric precautions including but not limited to vaginal bleeding, contractions, leaking of fluid and fetal movement were reviewed in detail with the patient. Please refer to After Visit Summary  for other counseling recommendations.     Future Appointments  Date Time Provider Department Center  08/15/2019  9:15 AM Levie Heritage, DO [facility] None    Rhona Raider [place], DO 07/18/2019 10:19 AM

## 2019-07-19 LAB — RPR: RPR Ser Ql: NONREACTIVE

## 2019-07-19 LAB — GLUCOSE TOLERANCE, 2 HOURS W/ 1HR
Glucose, 1 hour: 125 mg/dL (ref 65–179)
Glucose, 2 hour: 115 mg/dL (ref 65–152)
Glucose, Fasting: 84 mg/dL (ref 65–91)

## 2019-07-19 LAB — CBC
Hematocrit: 30.3 % — ABNORMAL LOW (ref 34.0–46.6)
Hemoglobin: 10.4 g/dL — ABNORMAL LOW (ref 11.1–15.9)
MCH: 28.3 pg (ref 26.6–33.0)
MCHC: 34.3 g/dL (ref 31.5–35.7)
MCV: 82 fL (ref 79–97)
Platelets: 277 10*3/uL (ref 150–450)
RBC: 3.68 x10E6/uL — ABNORMAL LOW (ref 3.77–5.28)
RDW: 13.3 % (ref 11.7–15.4)
WBC: 8.8 10*3/uL (ref 3.4–10.8)

## 2019-07-19 LAB — HIV ANTIBODY (ROUTINE TESTING W REFLEX): HIV Screen 4th Generation wRfx: NONREACTIVE

## 2019-08-15 ENCOUNTER — Ambulatory Visit (INDEPENDENT_AMBULATORY_CARE_PROVIDER_SITE_OTHER): Payer: Medicaid Other | Admitting: Family Medicine

## 2019-08-15 ENCOUNTER — Other Ambulatory Visit: Payer: Self-pay

## 2019-08-15 VITALS — BP 122/71 | HR 100 | Wt 240.0 lb

## 2019-08-15 DIAGNOSIS — O99613 Diseases of the digestive system complicating pregnancy, third trimester: Secondary | ICD-10-CM

## 2019-08-15 DIAGNOSIS — Z3A30 30 weeks gestation of pregnancy: Secondary | ICD-10-CM

## 2019-08-15 DIAGNOSIS — M549 Dorsalgia, unspecified: Secondary | ICD-10-CM

## 2019-08-15 DIAGNOSIS — D573 Sickle-cell trait: Secondary | ICD-10-CM

## 2019-08-15 DIAGNOSIS — O26893 Other specified pregnancy related conditions, third trimester: Secondary | ICD-10-CM

## 2019-08-15 DIAGNOSIS — K219 Gastro-esophageal reflux disease without esophagitis: Secondary | ICD-10-CM

## 2019-08-15 DIAGNOSIS — Z3480 Encounter for supervision of other normal pregnancy, unspecified trimester: Secondary | ICD-10-CM

## 2019-08-15 NOTE — Progress Notes (Signed)
° °  PRENATAL VISIT NOTE  Subjective:  Kristen [name] is a 29 y.o. G2P1001 at [redacted]w[redacted]d being seen today for ongoing prenatal care.  She is currently monitored for the following issues for this low-risk pregnancy and has Sickle cell trait (HCC) and Supervision of other normal pregnancy, antepartum on their problem list.  Patient reports no complaints.  Contractions: Not present. Vag. Bleeding: None.  Movement: Present. Denies leaking of fluid.   The following portions of the patient's history were reviewed and updated as appropriate: allergies, current medications, past family history, past medical history, past social history, past surgical history and problem list.   Objective:   Vitals:   08/15/19 0932  BP: 122/71  Pulse: 100  Weight: 240 lb (108.9 kg)    Fetal Status: Fetal Heart Rate (bpm): 145   Movement: Present     General:  Alert, oriented and cooperative. Patient is in no acute distress.  Skin: Skin is warm and dry. No rash noted.   Cardiovascular: Normal heart rate noted  Respiratory: Normal respiratory effort, no problems with respiration noted  Abdomen: Soft, gravid, appropriate for gestational age.  Pain/Pressure: Present     Pelvic: Cervical exam deferred        Extremities: Normal range of motion.  Edema: None  Mental Status: Normal mood and affect. Normal behavior. Normal judgment and thought content.   Assessment and Plan:  Pregnancy: G2P1001 at [redacted]w[redacted]d 1. Supervision of other normal pregnancy, antepartum FHT and FH normal  2. Gastroesophageal reflux disease without esophagitis Pepcid  3. Sickle cell trait (HCC)  4. Back pain affecting pregnancy in third trimester   Preterm labor symptoms and general obstetric precautions including but not limited to vaginal bleeding, contractions, leaking of fluid and fetal movement were reviewed in detail with the patient. Please refer to After Visit Summary for other counseling recommendations.   No follow-ups on  file.  Future Appointments  Date Time Provider Department Center  08/30/2019  9:45 AM Levie Heritage, DO [facility] None    Levie Heritage, DO

## 2019-08-30 ENCOUNTER — Ambulatory Visit (INDEPENDENT_AMBULATORY_CARE_PROVIDER_SITE_OTHER): Payer: Medicaid Other | Admitting: Family Medicine

## 2019-08-30 ENCOUNTER — Other Ambulatory Visit: Payer: Self-pay

## 2019-08-30 VITALS — BP 110/67 | HR 123 | Wt 238.1 lb

## 2019-08-30 DIAGNOSIS — Z3A32 32 weeks gestation of pregnancy: Secondary | ICD-10-CM

## 2019-08-30 DIAGNOSIS — D573 Sickle-cell trait: Secondary | ICD-10-CM

## 2019-08-30 DIAGNOSIS — Z3483 Encounter for supervision of other normal pregnancy, third trimester: Secondary | ICD-10-CM

## 2019-08-30 DIAGNOSIS — Z3480 Encounter for supervision of other normal pregnancy, unspecified trimester: Secondary | ICD-10-CM

## 2019-08-30 NOTE — Progress Notes (Signed)
   PRENATAL VISIT NOTE  Subjective:  Kristen [name] is a 29 y.o. G2P1001 at [redacted]w[redacted]d being seen today for ongoing prenatal care.  She is currently monitored for the following issues for this low-risk pregnancy and has Sickle cell trait (HCC) and Supervision of other normal pregnancy, antepartum on their problem list.  Patient reports occasional contractions.  Contractions: Irritability. Vag. Bleeding: None.  Movement: Present. Denies leaking of fluid.   The following portions of the patient's history were reviewed and updated as appropriate: allergies, current medications, past family history, past medical history, past social history, past surgical history and problem list.   Objective:   Vitals:   08/30/19 1021  BP: 110/67  Pulse: (!) 123  Weight: 238 lb 1.3 oz (108 kg)    Fetal Status: Fetal Heart Rate (bpm): 156 Fundal Height: 33 cm Movement: Present     General:  Alert, oriented and cooperative. Patient is in no acute distress.  Skin: Skin is warm and dry. No rash noted.   Cardiovascular: Normal heart rate noted  Respiratory: Normal respiratory effort, no problems with respiration noted  Abdomen: Soft, gravid, appropriate for gestational age.  Pain/Pressure: Present     Pelvic: Cervical exam deferred        Extremities: Normal range of motion.  Edema: None  Mental Status: Normal mood and affect. Normal behavior. Normal judgment and thought content.   Assessment and Plan:  Pregnancy: G2P1001 at [redacted]w[redacted]d  1. Supervision of other normal pregnancy, antepartum FHT and FH normal  2. Sickle cell trait (HCC)    Preterm labor symptoms and general obstetric precautions including but not limited to vaginal bleeding, contractions, leaking of fluid and fetal movement were reviewed in detail with the patient. Please refer to After Visit Summary for other counseling recommendations.   Return in about 2 weeks (around 09/13/2019) for OB f/u, In Office.  Future Appointments  Date Time  Provider Department Center  09/13/2019 10:30 AM Willodean Rosenthal, MD [facility] None  09/27/2019 11:00 AM Anyanwu, Jethro Bastos, MD [facility] None    Levie Heritage, DO

## 2019-09-13 ENCOUNTER — Encounter: Payer: Self-pay | Admitting: Obstetrics & Gynecology

## 2019-09-13 ENCOUNTER — Ambulatory Visit (INDEPENDENT_AMBULATORY_CARE_PROVIDER_SITE_OTHER): Payer: Medicaid Other | Admitting: Obstetrics & Gynecology

## 2019-09-13 ENCOUNTER — Other Ambulatory Visit: Payer: Self-pay

## 2019-09-13 VITALS — BP 137/79 | HR 90 | Wt 240.0 lb

## 2019-09-13 DIAGNOSIS — Z3480 Encounter for supervision of other normal pregnancy, unspecified trimester: Secondary | ICD-10-CM

## 2019-09-13 DIAGNOSIS — Z3A34 34 weeks gestation of pregnancy: Secondary | ICD-10-CM

## 2019-09-13 DIAGNOSIS — O99013 Anemia complicating pregnancy, third trimester: Secondary | ICD-10-CM

## 2019-09-13 DIAGNOSIS — D573 Sickle-cell trait: Secondary | ICD-10-CM

## 2019-09-13 NOTE — Progress Notes (Signed)
   PRENATAL VISIT NOTE  Subjective:  Kristen [name] is a 29 y.o. G2P1001 at [redacted]w[redacted]d being seen today for ongoing prenatal care.  She is currently monitored for the following issues for this low-risk pregnancy and has Sickle cell trait (HCC) and Supervision of other normal pregnancy, antepartum on their problem list.  Patient reports no complaints.  Contractions: Irritability. Vag. Bleeding: None.  Movement: Present. Denies leaking of fluid.   The following portions of the patient's history were reviewed and updated as appropriate: allergies, current medications, past family history, past medical history, past social history, past surgical history and problem list.   Objective:   Vitals:   09/13/19 1039  BP: 137/79  Pulse: 90  Weight: 240 lb (108.9 kg)    Fetal Status: Fetal Heart Rate (bpm): 150   Movement: Present     General:  Alert, oriented and cooperative. Patient is in no acute distress.  Skin: Skin is warm and dry. No rash noted.   Cardiovascular: Normal heart rate noted  Respiratory: Normal respiratory effort, no problems with respiration noted  Abdomen: Soft, gravid, appropriate for gestational age.  Pain/Pressure: Present     Pelvic: Cervical exam deferred        Extremities: Normal range of motion.  Edema: None  Mental Status: Normal mood and affect. Normal behavior. Normal judgment and thought content.   Assessment and Plan:  Pregnancy: G2P1001 at [redacted]w[redacted]d 1. Supervision of other normal pregnancy, antepartum FH and FHR wnl cx next visit  2. Sickle cell trait (HCC)  Preterm labor symptoms and general obstetric precautions including but not limited to vaginal bleeding, contractions, leaking of fluid and fetal movement were reviewed in detail with the patient. Please refer to After Visit Summary for other counseling recommendations.   Return in about 2 weeks (around 09/27/2019) for in person.  Future Appointments  Date Time Provider Department Center  09/27/2019 11:00 AM  Anyanwu, Jethro Bastos, MD [facility] None    Willodean Rosenthal, MD

## 2019-09-27 ENCOUNTER — Other Ambulatory Visit (HOSPITAL_COMMUNITY)
Admission: RE | Admit: 2019-09-27 | Discharge: 2019-09-27 | Disposition: A | Discharge status: Home / Self Care | Payer: Medicaid Other | Source: Ambulatory Visit | Attending: Obstetrics & Gynecology | Admitting: Obstetrics & Gynecology

## 2019-09-27 ENCOUNTER — Other Ambulatory Visit: Payer: Self-pay

## 2019-09-27 ENCOUNTER — Encounter: Payer: Self-pay | Admitting: Obstetrics & Gynecology

## 2019-09-27 ENCOUNTER — Ambulatory Visit (INDEPENDENT_AMBULATORY_CARE_PROVIDER_SITE_OTHER): Payer: Medicaid Other | Admitting: Obstetrics & Gynecology

## 2019-09-27 VITALS — BP 116/78 | HR 104 | Wt 238.0 lb

## 2019-09-27 DIAGNOSIS — O26843 Uterine size-date discrepancy, third trimester: Secondary | ICD-10-CM

## 2019-09-27 DIAGNOSIS — Z3483 Encounter for supervision of other normal pregnancy, third trimester: Secondary | ICD-10-CM

## 2019-09-27 DIAGNOSIS — Z3480 Encounter for supervision of other normal pregnancy, unspecified trimester: Secondary | ICD-10-CM

## 2019-09-27 DIAGNOSIS — Z3A36 36 weeks gestation of pregnancy: Secondary | ICD-10-CM

## 2019-09-27 NOTE — Patient Instructions (Signed)
Return to office for any scheduled appointments. Call the office or go to the MAU at [facility] if:  You begin to have strong, frequent contractions  Your water breaks.  Sometimes it is a big gush of fluid, sometimes it is just a trickle that keeps getting your panties wet or running down your legs  You have vaginal bleeding.  It is normal to have a small amount of spotting if your cervix was checked.   You do not feel your baby moving like normal.  If you do not, get something to eat and drink and lay down and focus on feeling your baby move.   If your baby is still not moving like normal, you should call the office or go to MAU.  Any other obstetric concerns.   

## 2019-09-27 NOTE — Progress Notes (Signed)
   PRENATAL VISIT NOTE  Subjective:  Kristen [name] is a 29 y.o. G2P1001 at [redacted]w[redacted]d being seen today for ongoing prenatal care.  She is currently monitored for the following issues for this low-risk pregnancy and has Sickle cell trait (HCC) and Supervision of other normal pregnancy, antepartum on their problem list.  Patient reports no complaints.  Contractions: Irregular. Vag. Bleeding: None.  Movement: Present. Denies leaking of fluid.   The following portions of the patient's history were reviewed and updated as appropriate: allergies, current medications, past family history, past medical history, past social history, past surgical history and problem list.   Objective:   Vitals:   09/27/19 1106  BP: 116/78  Pulse: 104  Weight: (!) 238 lb (108 kg)    Fetal Status: Fetal Heart Rate (bpm): 145 Fundal Height: 41 cm Movement: Present  Presentation: Vertex  General:  Alert, oriented and cooperative. Patient is in no acute distress.  Skin: Skin is warm and dry. No rash noted.   Cardiovascular: Normal heart rate noted  Respiratory: Normal respiratory effort, no problems with respiration noted  Abdomen: Soft, gravid, appropriate for gestational age.  Pain/Pressure: Present     Pelvic: Cervical exam performed in the presence of a chaperone Dilation: 1 Effacement (%): 50 Station: Ballotable Pelvic cultures done.  Extremities: Normal range of motion.  Edema: Trace  Mental Status: Normal mood and affect. Normal behavior. Normal judgment and thought content.   Assessment and Plan:  Pregnancy: G2P1001 at [redacted]w[redacted]d 1. Significant discrepancy between uterine size and clinical dates, antepartum, third trimester MFM scan ordered, will follow up results and manage accordingly. - [place] MFM OB FOLLOW UP; Future  2. [redacted] weeks gestation of pregnancy 3. Supervision of other normal pregnancy, antepartum Pelvic cultures done today, will follow up results and manage accordingly. - Culture, beta strep (group b  only) - GC/Chlamydia probe amp ([place])not at [facility] Preterm labor symptoms and general obstetric precautions including but not limited to vaginal bleeding, contractions, leaking of fluid and fetal movement were reviewed in detail with the patient. Please refer to After Visit Summary for other counseling recommendations.   Return in about 2 weeks (around 10/11/2019) for OFFICE 38 week OB Visit.  No future appointments.  Jaynie Collins, MD

## 2019-09-30 ENCOUNTER — Other Ambulatory Visit: Payer: Self-pay

## 2019-09-30 ENCOUNTER — Ambulatory Visit: Payer: Medicaid Other

## 2019-09-30 ENCOUNTER — Encounter: Payer: Self-pay | Admitting: Obstetrics & Gynecology

## 2019-09-30 ENCOUNTER — Ambulatory Visit: Payer: Medicaid Other | Attending: Obstetrics & Gynecology

## 2019-09-30 DIAGNOSIS — O26843 Uterine size-date discrepancy, third trimester: Secondary | ICD-10-CM | POA: Insufficient documentation

## 2019-09-30 DIAGNOSIS — O99013 Anemia complicating pregnancy, third trimester: Secondary | ICD-10-CM

## 2019-09-30 DIAGNOSIS — O99212 Obesity complicating pregnancy, second trimester: Secondary | ICD-10-CM | POA: Diagnosis not present

## 2019-09-30 DIAGNOSIS — Z3A36 36 weeks gestation of pregnancy: Secondary | ICD-10-CM | POA: Insufficient documentation

## 2019-09-30 DIAGNOSIS — D573 Sickle-cell trait: Secondary | ICD-10-CM

## 2019-09-30 DIAGNOSIS — E669 Obesity, unspecified: Secondary | ICD-10-CM

## 2019-09-30 DIAGNOSIS — Z363 Encounter for antenatal screening for malformations: Secondary | ICD-10-CM

## 2019-09-30 LAB — GC/CHLAMYDIA PROBE AMP (~~LOC~~) NOT AT ARMC
Chlamydia: NEGATIVE
Comment: NEGATIVE
Comment: NORMAL
Neisseria Gonorrhea: NEGATIVE

## 2019-09-30 LAB — CULTURE, BETA STREP (GROUP B ONLY): Strep Gp B Culture: POSITIVE — AB

## 2019-10-10 ENCOUNTER — Other Ambulatory Visit: Payer: Self-pay

## 2019-10-10 ENCOUNTER — Ambulatory Visit (INDEPENDENT_AMBULATORY_CARE_PROVIDER_SITE_OTHER): Payer: Medicaid Other | Admitting: Family Medicine

## 2019-10-10 VITALS — BP 108/75 | HR 104 | Wt 236.0 lb

## 2019-10-10 DIAGNOSIS — Z3480 Encounter for supervision of other normal pregnancy, unspecified trimester: Secondary | ICD-10-CM

## 2019-10-10 DIAGNOSIS — O99820 Streptococcus B carrier state complicating pregnancy: Secondary | ICD-10-CM

## 2019-10-10 DIAGNOSIS — Z3A38 38 weeks gestation of pregnancy: Secondary | ICD-10-CM

## 2019-10-10 DIAGNOSIS — D573 Sickle-cell trait: Secondary | ICD-10-CM

## 2019-10-10 NOTE — Progress Notes (Signed)
   PRENATAL VISIT NOTE  Subjective:  Kristen [name] is a 29 y.o. G2P1001 at [redacted]w[redacted]d being seen today for ongoing prenatal care.  She is currently monitored for the following issues for this low-risk pregnancy and has Sickle cell trait (HCC); Group B Streptococcus carrier, +RV culture, currently pregnant; and Supervision of other normal pregnancy, antepartum on their problem list.  Patient reports no complaints.  Contractions: Irregular. Vag. Bleeding: Scant.  Movement: Present. Denies leaking of fluid.   The following portions of the patient's history were reviewed and updated as appropriate: allergies, current medications, past family history, past medical history, past social history, past surgical history and problem list.   Objective:   Vitals:   10/10/19 1045  BP: 108/75  Pulse: (!) 104  Weight: 236 lb (107 kg)    Fetal Status: Fetal Heart Rate (bpm): 125 Fundal Height: 41 cm Movement: Present     General:  Alert, oriented and cooperative. Patient is in no acute distress.  Skin: Skin is warm and dry. No rash noted.   Cardiovascular: Normal heart rate noted  Respiratory: Normal respiratory effort, no problems with respiration noted  Abdomen: Soft, gravid, appropriate for gestational age.  Pain/Pressure: Present     Pelvic: Cervical exam deferred        Extremities: Normal range of motion.  Edema: None  Mental Status: Normal mood and affect. Normal behavior. Normal judgment and thought content.   Assessment and Plan:  Pregnancy: G2P1001 at [redacted]w[redacted]d 1. [redacted] weeks gestation of pregnancy  2. Supervision of other normal pregnancy, antepartum FHT and FH normal  3. Sickle cell trait (HCC)  4. Group B Streptococcus carrier, +RV culture, currently pregnant Intrapartum PPx  Preterm labor symptoms and general obstetric precautions including but not limited to vaginal bleeding, contractions, leaking of fluid and fetal movement were reviewed in detail with the patient. Please refer to After  Visit Summary for other counseling recommendations.   No follow-ups on file.  Future Appointments  Date Time Provider Department Center  10/17/2019 10:30 AM Levie Heritage, DO [facility] None    Levie Heritage, DO

## 2019-10-15 ENCOUNTER — Inpatient Hospital Stay (HOSPITAL_COMMUNITY)
Admission: AD | Admit: 2019-10-15 | Discharge: 2019-10-17 | DRG: 807 | Disposition: A | Discharge status: Home / Self Care | Payer: Medicaid Other | Attending: Family Medicine | Admitting: Family Medicine

## 2019-10-15 ENCOUNTER — Encounter (HOSPITAL_COMMUNITY): Payer: Self-pay | Admitting: Obstetrics & Gynecology

## 2019-10-15 ENCOUNTER — Other Ambulatory Visit: Payer: Self-pay

## 2019-10-15 ENCOUNTER — Inpatient Hospital Stay (HOSPITAL_COMMUNITY): Payer: Medicaid Other | Admitting: Anesthesiology

## 2019-10-15 DIAGNOSIS — O99824 Streptococcus B carrier state complicating childbirth: Secondary | ICD-10-CM | POA: Diagnosis present

## 2019-10-15 DIAGNOSIS — D573 Sickle-cell trait: Secondary | ICD-10-CM | POA: Diagnosis present

## 2019-10-15 DIAGNOSIS — O4292 Full-term premature rupture of membranes, unspecified as to length of time between rupture and onset of labor: Principal | ICD-10-CM | POA: Diagnosis present

## 2019-10-15 DIAGNOSIS — R030 Elevated blood-pressure reading, without diagnosis of hypertension: Secondary | ICD-10-CM

## 2019-10-15 DIAGNOSIS — O99820 Streptococcus B carrier state complicating pregnancy: Secondary | ICD-10-CM

## 2019-10-15 DIAGNOSIS — Z3A39 39 weeks gestation of pregnancy: Secondary | ICD-10-CM

## 2019-10-15 DIAGNOSIS — O26893 Other specified pregnancy related conditions, third trimester: Secondary | ICD-10-CM | POA: Diagnosis present

## 2019-10-15 DIAGNOSIS — Z3480 Encounter for supervision of other normal pregnancy, unspecified trimester: Secondary | ICD-10-CM

## 2019-10-15 DIAGNOSIS — O99893 Other specified diseases and conditions complicating puerperium: Secondary | ICD-10-CM | POA: Diagnosis present

## 2019-10-15 DIAGNOSIS — O9902 Anemia complicating childbirth: Secondary | ICD-10-CM | POA: Diagnosis present

## 2019-10-15 DIAGNOSIS — Z20822 Contact with and (suspected) exposure to covid-19: Secondary | ICD-10-CM | POA: Diagnosis present

## 2019-10-15 LAB — CBC
HCT: 33.9 % — ABNORMAL LOW (ref 36.0–46.0)
Hemoglobin: 10.4 g/dL — ABNORMAL LOW (ref 12.0–15.0)
MCH: 24.2 pg — ABNORMAL LOW (ref 26.0–34.0)
MCHC: 30.7 g/dL (ref 30.0–36.0)
MCV: 78.8 fL — ABNORMAL LOW (ref 80.0–100.0)
Platelets: 293 10*3/uL (ref 150–400)
RBC: 4.3 MIL/uL (ref 3.87–5.11)
RDW: 16 % — ABNORMAL HIGH (ref 11.5–15.5)
WBC: 11 10*3/uL — ABNORMAL HIGH (ref 4.0–10.5)
nRBC: 0 % (ref 0.0–0.2)

## 2019-10-15 LAB — TYPE AND SCREEN
ABO/RH(D): B POS
Antibody Screen: NEGATIVE

## 2019-10-15 LAB — SARS CORONAVIRUS 2 BY RT PCR (HOSPITAL ORDER, PERFORMED IN ~~LOC~~ HOSPITAL LAB): SARS Coronavirus 2: NEGATIVE

## 2019-10-15 MED ORDER — PHENYLEPHRINE 40 MCG/ML (10ML) SYRINGE FOR IV PUSH (FOR BLOOD PRESSURE SUPPORT): 80.0000 ug | PREFILLED_SYRINGE | INTRAVENOUS | Status: DC | PRN

## 2019-10-15 MED ORDER — EPHEDRINE 5 MG/ML INJ: 10.0000 mg | INTRAVENOUS | Status: DC | PRN

## 2019-10-15 MED ORDER — OXYTOCIN BOLUS FROM INFUSION
333.0000 mL | Freq: Once | INTRAVENOUS | Status: AC
  Administered 2019-10-15: 333 mL via INTRAVENOUS

## 2019-10-15 MED ORDER — ACETAMINOPHEN 325 MG PO TABS
650.0000 mg | ORAL_TABLET | ORAL | Status: DC | PRN
  Administered 2019-10-15: 650 mg via ORAL
  Filled 2019-10-15: qty 2

## 2019-10-15 MED ORDER — OXYCODONE-ACETAMINOPHEN 5-325 MG PO TABS: 1.0000 | ORAL_TABLET | ORAL | Status: DC | PRN

## 2019-10-15 MED ORDER — LACTATED RINGERS IV SOLN: INTRAVENOUS | Status: DC

## 2019-10-15 MED ORDER — PENICILLIN G POT IN DEXTROSE 60000 UNIT/ML IV SOLN
3.0000 10*6.[IU] | INTRAVENOUS | Status: DC
  Administered 2019-10-15 (×3): 3 10*6.[IU] via INTRAVENOUS
  Filled 2019-10-15 (×3): qty 50

## 2019-10-15 MED ORDER — TERBUTALINE SULFATE 1 MG/ML IJ SOLN
0.2500 mg | Freq: Once | INTRAMUSCULAR | Status: AC | PRN
  Administered 2019-10-15: 0.25 mg via SUBCUTANEOUS
  Filled 2019-10-15: qty 1

## 2019-10-15 MED ORDER — LACTATED RINGERS IV SOLN
500.0000 mL | INTRAVENOUS | Status: DC | PRN
  Administered 2019-10-15 (×4): 500 mL via INTRAVENOUS

## 2019-10-15 MED ORDER — LIDOCAINE HCL (PF) 1 % IJ SOLN: 30.0000 mL | INTRAMUSCULAR | Status: DC | PRN

## 2019-10-15 MED ORDER — LACTATED RINGERS IV SOLN
500.0000 mL | Freq: Once | INTRAVENOUS | Status: AC
  Administered 2019-10-15: 500 mL via INTRAVENOUS

## 2019-10-15 MED ORDER — OXYTOCIN-SODIUM CHLORIDE 30-0.9 UT/500ML-% IV SOLN
2.5000 [IU]/h | INTRAVENOUS | Status: DC
  Administered 2019-10-15: 2.5 [IU]/h via INTRAVENOUS
  Filled 2019-10-15: qty 500

## 2019-10-15 MED ORDER — LIDOCAINE HCL (PF) 1 % IJ SOLN
INTRAMUSCULAR | Status: DC | PRN
  Administered 2019-10-15: 5 mL via EPIDURAL

## 2019-10-15 MED ORDER — OXYCODONE-ACETAMINOPHEN 5-325 MG PO TABS: 2.0000 | ORAL_TABLET | ORAL | Status: DC | PRN

## 2019-10-15 MED ORDER — DIPHENHYDRAMINE HCL 50 MG/ML IJ SOLN: 12.5000 mg | INTRAMUSCULAR | Status: DC | PRN

## 2019-10-15 MED ORDER — LACTATED RINGERS AMNIOINFUSION: INTRAVENOUS | Status: DC

## 2019-10-15 MED ORDER — FENTANYL-BUPIVACAINE-NACL 0.5-0.125-0.9 MG/250ML-% EP SOLN
12.0000 mL/h | EPIDURAL | Status: DC | PRN
  Filled 2019-10-15: qty 250

## 2019-10-15 MED ORDER — ONDANSETRON HCL 4 MG/2ML IJ SOLN: 4.0000 mg | Freq: Four times a day (QID) | INTRAMUSCULAR | Status: DC | PRN

## 2019-10-15 MED ORDER — FLEET ENEMA 7-19 GM/118ML RE ENEM: 1.0000 | ENEMA | Freq: Every day | RECTAL | Status: DC | PRN

## 2019-10-15 MED ORDER — SODIUM CHLORIDE (PF) 0.9 % IJ SOLN
INTRAMUSCULAR | Status: DC | PRN
  Administered 2019-10-15: 12 mL/h via EPIDURAL

## 2019-10-15 MED ORDER — OXYTOCIN-SODIUM CHLORIDE 30-0.9 UT/500ML-% IV SOLN: 1.0000 m[IU]/min | INTRAVENOUS | Status: DC

## 2019-10-15 MED ORDER — FENTANYL CITRATE (PF) 100 MCG/2ML IJ SOLN
100.0000 ug | INTRAMUSCULAR | Status: DC | PRN
  Administered 2019-10-15 (×2): 100 ug via INTRAVENOUS
  Filled 2019-10-15 (×2): qty 2

## 2019-10-15 MED ORDER — SODIUM CHLORIDE 0.9 % IV SOLN
5.0000 10*6.[IU] | Freq: Once | INTRAVENOUS | Status: AC
  Administered 2019-10-15: 5 10*6.[IU] via INTRAVENOUS
  Filled 2019-10-15 (×2): qty 5

## 2019-10-15 MED ORDER — OXYTOCIN-SODIUM CHLORIDE 30-0.9 UT/500ML-% IV SOLN
1.0000 m[IU]/min | INTRAVENOUS | Status: DC
  Administered 2019-10-15: 2 m[IU]/min via INTRAVENOUS

## 2019-10-15 MED ORDER — SOD CITRATE-CITRIC ACID 500-334 MG/5ML PO SOLN
30.0000 mL | ORAL | Status: DC | PRN
  Administered 2019-10-15: 30 mL via ORAL
  Filled 2019-10-15: qty 30

## 2019-10-15 NOTE — Discharge Summary (Signed)
Postpartum Discharge Summary      Patient Name: Kristen [name] DOB: Mar 08, 1990 MRN: [id number]  Date of admission: 10/15/2019 Delivery date:10/15/2019  Delivering provider: Arrie Senate  Date of discharge: 10/17/2019  Admitting diagnosis: Normal labor [O80, Z37.9] Intrauterine pregnancy: [redacted]w[redacted]d    Secondary diagnosis:  Active Problems:   Sickle cell trait ([place]   Group B Streptococcus carrier, +RV culture, currently pregnant   Supervision of other normal pregnancy, antepartum   Normal labor   Vaginal delivery   First degree perineal laceration   Blood pressure elevated without history of HTN  Additional problems: none    Discharge diagnosis: Term Pregnancy Delivered                                              Post partum procedures:none Augmentation: Pitocin Complications: None  Hospital course: Onset of Labor With Vaginal Delivery      29y.o. yo G2P1001 at 27w3das admitted in Latent Labor on 10/15/2019. Patient with PROM 8/17 @0500  but was initially not feeling contractions or making cervical change. Upon arrival to L&D patient had made cervical change so augmentation was deferred at that time. As contractions spaced out, decision was made to initiate pitocin. Intermittent late/variable decelerations with multiple pitocin breaks and terbutaline administered. Patient continued to progress and ultimately was complete and pushed to an uncomplicated SVD. Patient had an uncomplicated labor course as follows:  Membrane Rupture Time/Date: 5:50 AM ,10/15/2019   Delivery Method:Vaginal, Spontaneous  Episiotomy: None  Lacerations:  1st degree  Patient had an uncomplicated postpartum course.  She is ambulating, tolerating a regular diet, passing flatus, and urinating well. Patient is discharged home in stable condition on 10/17/19.  Newborn Data: Birth date:10/15/2019  Birth time:10:00 PM  Gender:Female  Living status:Living  Apgars:7 ,9  Weight:3127 g   Magnesium Sulfate  received: No BMZ received: No Rhophylac:N/A MMR:N/A T-DaP:Given prenatally Flu: N/A Transfusion:No  Physical exam  Vitals:   10/16/19 0535 10/16/19 0852 10/16/19 1439 10/16/19 2300  BP: 131/77 118/77 124/84 123/87  Pulse: 80 81 86 98  Resp:   16 17  Temp: 98.4 F (36.9 C) 98.3 F (36.8 C) 98.6 F (37 C) 98.4 F (36.9 C)  TempSrc: Oral Oral Oral Oral  SpO2: 99%  99% 100%  Weight:      Height:       General: alert, cooperative and no distress Lochia: appropriate Uterine Fundus: firm Incision: N/A DVT Evaluation: No evidence of DVT seen on physical exam. Labs: Lab Results  Component Value Date   WBC 11.0 (H) 10/15/2019   HGB 10.4 (L) 10/15/2019   HCT 33.9 (L) 10/15/2019   MCV 78.8 (L) 10/15/2019   PLT 293 10/15/2019   CMP Latest Ref Rng & Units 10/16/2019  Glucose 70 - 99 mg/dL 112(H)  BUN 6 - 20 mg/dL <5(L)  Creatinine 0.44 - 1.00 mg/dL 0.93  Sodium 135 - 145 mmol/L 137  Potassium 3.5 - 5.1 mmol/L 3.6  Chloride 98 - 111 mmol/L 109  CO2 22 - 32 mmol/L 20(L)  Calcium 8.9 - 10.3 mg/dL 8.8(L)  Total Protein 6.5 - 8.1 g/dL 5.6(L)  Total Bilirubin 0.3 - 1.2 mg/dL 0.8  Alkaline Phos 38 - 126 U/L 106  AST 15 - 41 U/L 24  ALT 0 - 44 U/L 11   Edinburgh Score: Edinburgh Postnatal Depression Scale  Screening Tool 12/01/2016  I have been able to laugh and see the funny side of things. 0  I have looked forward with enjoyment to things. 0  I have blamed myself unnecessarily when things went wrong. 3  I have been anxious or worried for no good reason. 0  I have felt scared or panicky for no good reason. 0  Things have been getting on top of me. 0  I have been so unhappy that I have had difficulty sleeping. 0  I have felt sad or miserable. 0  I have been so unhappy that I have been crying. 0  The thought of harming myself has occurred to me. 0  Edinburgh Postnatal Depression Scale Total 3     After visit meds:  Allergies as of 10/17/2019   No Known Allergies      Medication List    STOP taking these medications   AMBULATORY NON FORMULARY MEDICATION   famotidine 20 MG tablet Commonly known as: PEPCID   ondansetron 4 MG disintegrating tablet Commonly known as: Zofran ODT     TAKE these medications   acetaminophen 500 MG tablet Commonly known as: TYLENOL Take 2 tablets (1,000 mg total) by mouth every 6 (six) hours as needed (for pain scale < 4).   amLODipine 5 MG tablet Commonly known as: NORVASC Take 1 tablet (5 mg total) by mouth daily.   ibuprofen 600 MG tablet Commonly known as: ADVIL Take 1 tablet (600 mg total) by mouth every 6 (six) hours.   polyethylene glycol 17 g packet Commonly known as: MiraLax Take 17 g by mouth daily.   prenatal multivitamin Tabs tablet Take 1 tablet by mouth daily at 12 noon.        Discharge home in stable condition Infant Feeding: Breast Infant Disposition:home with mother Discharge instruction: per After Visit Summary and Postpartum booklet. Activity: Advance as tolerated. Pelvic rest for 6 weeks.  Diet: routine diet Future Appointments: Future Appointments  Date Time Provider Mul[place]  10/24/2019  1:30 PM [facility] NURSE [facility] None   Follow up Visit:   Please schedule this patient for a Virtual postpartum visit in 4 weeks with the following provider: Any provider. Additional Postpartum F/U:none  Low risk pregnancy complicated by: uncomplicated Delivery mode:  Vaginal, Spontaneous  Anticipated Birth Control:  POPs   2/58/27 Arrie Senate, MD

## 2019-10-15 NOTE — Progress Notes (Addendum)
Labor Progress Note Kristen [name] is a 29 y.o. G2P1001 at [redacted]w[redacted]d presented for PROM @0500  today.  S: Pain better controlled since Epidural. For contraception she does not want IUD, believes she will try OCP at outpatient post-partum visit.   O:  BP (!) 100/51   Pulse 76   Temp 98.5 F (36.9 C) (Oral)   Resp 17   Ht 5\' 5"  (1.651 m)   Wt 108 kg   LMP 01/12/2019   SpO2 100%   BMI 39.61 kg/m  EFM: baseline 120/ moderate variability / +accelerations, + episodic decels  CVE: Dilation: 6.5 Effacement (%): 80 Station: -2 Presentation: Vertex Exam by:: Dr. 01/14/2019   A&P: 29 y.o. G2P1001 [redacted]w[redacted]d  #Labor: Progressing well without augmentation. Feeling contractions every 5-10 minutes. On peanut ball, foley in. #Pain: Comfortable with Epidural #FWB:  Cat II, reassuring for moderate variability and presence of accels #GBS positive on PCN  Anticipate NSVD  34, DO 11:18 AM  GME ATTESTATION:  I saw and evaluated the patient. I agree with the findings and the plan of care as documented in the resident's note.  [redacted]w[redacted]d, DO OB Fellow, Faculty [facility], Center for [facility] 10/15/2019 11:39 AM

## 2019-10-15 NOTE — Anesthesia Procedure Notes (Signed)
Epidural Patient location during procedure: OB Start time: 10/15/2019 9:21 AM End time: 10/15/2019 9:31 AM  Staffing Anesthesiologist: Trevor Iha, MD Performed: anesthesiologist   Preanesthetic Checklist Completed: patient identified, IV checked, site marked, risks and benefits discussed, surgical consent, monitors and equipment checked, pre-op evaluation and timeout performed  Epidural Patient position: sitting Prep: DuraPrep and site prepped and draped Patient monitoring: continuous pulse ox and blood pressure Approach: midline Location: L3-L4 Injection technique: LOR air  Needle:  Needle type: Tuohy  Needle gauge: 17 G Needle length: 9 cm and 9 Needle insertion depth: 7 cm Catheter type: closed end flexible Catheter size: 19 Gauge Catheter at skin depth: 13 cm Test dose: negative  Assessment Events: blood not aspirated, injection not painful, no injection resistance, no paresthesia and negative IV test  Additional Notes Patient identified. Risks/Benefits/Options discussed with patient including but not limited to bleeding, infection, nerve damage, paralysis, failed block, incomplete pain control, headache, blood pressure changes, nausea, vomiting, reactions to medication both or allergic, itching and postpartum back pain. Confirmed with bedside nurse the patient's most recent platelet count. Confirmed with patient that they are not currently taking any anticoagulation, have any bleeding history or any family history of bleeding disorders. Patient expressed understanding and wished to proceed. All questions were answered. Sterile technique was used throughout the entire procedure. Please see nursing notes for vital signs. Test dose was given through epidural needle and negative prior to continuing to dose epidural or start infusion. Warning signs of high block given to the patient including shortness of breath, tingling/numbness in hands, complete motor block, or any  concerning symptoms with instructions to call for help. Patient was given instructions on fall risk and not to get out of bed. All questions and concerns addressed with instructions to call with any issues. 1 Attempt (S) . Patient tolerated procedure well.

## 2019-10-15 NOTE — H&P (Signed)
OBSTETRIC ADMISSION HISTORY AND PHYSICAL  Kristen [name] is a 29 y.o. female G2P1001 with IUP at 7w3dby LMP c/w 8wk presenting for LOF starting at 0500 on 10/15/19. Initially was having contractions every 20 minutes and have increased in frequency to every 1-2 minutes. She reports +FMs, no VB, no blurry vision, headaches or peripheral edema, and RUQ pain.  She plans on breast feeding. She is undecided on birth control. She received her prenatal care at [facility].  Dating: By LMP c/w 8wk u/s --->  Estimated Date of Delivery: 10/19/19  Sono:    09/30/19@[redacted]w[redacted]d , CWD, normal anatomy, cephalic presentation, [id number] 45% EFW   Prenatal History/Complications:  GBS positive Sickle cell trait (FOB neg)  Past Medical History: Past Medical History:  Diagnosis Date  . Bacterial vaginitis 07/01/2016  . Medical history non-contributory     Past Surgical History: Past Surgical History:  Procedure Laterality Date  . NO PAST SURGERIES      Obstetrical History: OB History    Gravida  2   Para  1   Term  1   Preterm  0   AB  0   Living  1     SAB  0   TAB  0   Ectopic  0   Multiple  0   Live Births  1           Social History Social History   Socioeconomic History  . Marital status: Single    Spouse name: Not on file  . Number of children: Not on file  . Years of education: Not on file  . Highest education level: Not on file  Occupational History  . Not on file  Tobacco Use  . Smoking status: Never Smoker  . Smokeless tobacco: Never Used  Substance and Sexual Activity  . Alcohol use: Yes    Comment: occ  . Drug use: No  . Sexual activity: Yes    Birth control/protection: None  Other Topics Concern  . Not on file  Social History Narrative  . Not on file   Social Determinants of Health   Financial Resource Strain:   . Difficulty of Paying Living Expenses:   Food Insecurity:   . Worried About RCharity fundraiserin the Last Year:   . RArboriculturistin the  Last Year:   Transportation Needs:   . LFilm/video editor(Medical):   .Marland KitchenLack of Transportation (Non-Medical):   Physical Activity:   . Days of Exercise per Week:   . Minutes of Exercise per Session:   Stress:   . Feeling of Stress :   Social Connections:   . Frequency of Communication with Friends and Family:   . Frequency of Social Gatherings with Friends and Family:   . Attends Religious Services:   . Active Member of Clubs or Organizations:   . Attends CArchivistMeetings:   .Marland KitchenMarital Status:     Family History: Family History  Problem Relation Age of Onset  . Heart disease Father     Allergies: No Known Allergies  Medications Prior to Admission  Medication Sig Dispense Refill Last Dose  . AMBULATORY NON FORMULARY MEDICATION 1 Device by Other route once a week. Blood Pressure Cuff/Large Monitored Regularly at home ICD 10: Z34.90 LROB 1 kit 0   . famotidine (PEPCID) 20 MG tablet Take 1 tablet (20 mg total) by mouth 2 (two) times daily. 60 tablet 3   . ondansetron (ZOFRAN ODT) 4  MG disintegrating tablet Take 1 tablet (4 mg total) by mouth every 6 (six) hours as needed for nausea. 20 tablet 0   . Prenatal Vit-Fe Fumarate-FA (MULTIVITAMIN-PRENATAL) 27-0.8 MG TABS tablet Take 1 tablet by mouth daily at 12 noon.        Review of Systems   All systems reviewed and negative except as stated in HPI  Last menstrual period 01/12/2019. General appearance: alert, cooperative and no distress Lungs: normal effort Heart: regular rate and rhythm Abdomen: soft, non-tender; bowel sounds normal Pelvic: as noted below Extremities: Homans sign is negative, no sign of DVT Presentation: cephalic by BSUS Fetal monitoringBaseline: 130 bpm, Variability: Good {> 6 bpm), Accelerations: Reactive and Decelerations: Absent Uterine activityFrequency: Every 4-5 minutes Dilation: 1 Effacement (%): 50 Station: Ballotable Exam by:: Patricia Pesa, RN   Prenatal labs: ABO, Rh:  B/Positive/-- (01/14 1541) Antibody: Negative (01/14 1541) Rubella: 4.27 (01/14 1541) RPR: Non Reactive (05/20 1018)  HBsAg: Negative (01/14 1541)  HIV: Non Reactive (05/20 1018)  GBS: Positive/-- (07/30 1126)  2 hr Glucola passed Genetic screening  Low risk Anatomy US normal  Prenatal Transfer Tool  Maternal Diabetes: No Genetic Screening: Normal Maternal Ultrasounds/Referrals: Normal Fetal Ultrasounds or other Referrals:  None Maternal Substance Abuse:  No Significant Maternal Medications:  None Significant Maternal Lab Results: Group B Strep positive  No results found for this or any previous visit (from the past 24 hour(s)).  Patient Active Problem List   Diagnosis Date Noted  . Supervision of other normal pregnancy, antepartum 03/14/2019  . Group B Streptococcus carrier, +RV culture, currently pregnant 10/17/2016  . Sickle cell trait ([place]) 04/26/2016    Assessment/Plan:  Kristen [name] is a 29 y.o. G2P1001 at 62w3dhere for SOL.  #Labor: Patient presents with PROM @0500  on 8/17 and is now contracting every 4-5 minutes. Making good cervical change since check in MAU. Will continue expectant management. Consider augmentation with pitocin if contractions space out, once patient closer to adequate GBS ppx. #Pain: Desires CSE #FWB: Cat 1 #ID: GBS positive, PCN #MOF: breast #MOC: undecided #Circ:  yes  AArrie Senate MD  10/15/2019, 6:28 AM

## 2019-10-15 NOTE — Anesthesia Preprocedure Evaluation (Signed)
Anesthesia Evaluation  Patient identified by MRN, date of birth, ID band Patient awake    Reviewed: Allergy & Precautions, NPO status , Patient's Chart, lab work & pertinent test results  Airway Mallampati: II  TM Distance: >3 FB Neck ROM: Full    Dental no notable dental hx. (+) Teeth Intact, Dental Advisory Given   Pulmonary neg pulmonary ROS,    Pulmonary exam normal breath sounds clear to auscultation       Cardiovascular Exercise Tolerance: Good negative cardio ROS Normal cardiovascular exam Rhythm:Regular Rate:Normal     Neuro/Psych negative neurological ROS  negative psych ROS   GI/Hepatic negative GI ROS, Neg liver ROS,   Endo/Other  negative endocrine ROS  Renal/GU negative Renal ROS     Musculoskeletal   Abdominal (+) + obese,   Peds  Hematology  (+) Sickle cell trait , Lab Results      Component                Value               Date                      WBC                      11.0 (H)            10/15/2019                HGB                      10.4 (L)            10/15/2019                HCT                      33.9 (L)            10/15/2019                MCV                      78.8 (L)            10/15/2019                PLT                      293                 10/15/2019              Anesthesia Other Findings   Reproductive/Obstetrics (+) Pregnancy                             Anesthesia Physical Anesthesia Plan  ASA: III  Anesthesia Plan: Epidural   Post-op Pain Management:    Induction:   PONV Risk Score and Plan:   Airway Management Planned: Natural Airway  Additional Equipment:   Intra-op Plan:   Post-operative Plan:   Informed Consent: I have reviewed the patients History and Physical, chart, labs and discussed the procedure including the risks, benefits and alternatives for the proposed anesthesia with the patient or authorized  representative who has indicated his/her understanding and acceptance.       Plan Discussed with: CRNA  Anesthesia  Plan Comments:         Anesthesia Quick Evaluation

## 2019-10-15 NOTE — MAU Note (Signed)
Pt reports to MAU c/o ctx every 1-2 min. No bleeding. Pt reports +FM. Pt states she was leaking fluid a few minutes ago but is unsure if her water is broken.

## 2019-10-15 NOTE — Progress Notes (Addendum)
Labor Progress Note Kristen [name] is a 29 y.o. G2P1001 at [redacted]w[redacted]d presented for PROM @0500  S: Feels pressure.  O:  BP 120/76   Pulse 80   Temp 98.1 F (36.7 C) (Oral)   Resp 17   Ht 5\' 5"  (1.651 m)   Wt 108 kg   LMP 01/12/2019   SpO2 99%   BMI 39.61 kg/m  EFM: baseline 120/moderate variability/+accels, +subtle late decls  CVE: Dilation: 8 Effacement (%): 90 Station: 0 Presentation: Vertex Exam by:: Dr. 01/14/2019   A&P: 29 y.o. G2P1001 [redacted]w[redacted]d  #Labor: Contractions q3-4 minutes, MVU 130-140. Will start pitocin #Pain: per patient request #FWB: Cat II (subtle late decels), reassuring for moderate variability and presence of accels #GBS positive on PCN  Anticipate NSVD  34, DO 2:06 PM  GME ATTESTATION:  I saw and evaluated the patient. I agree with the findings and the plan of care as documented in the resident's note.  [redacted]w[redacted]d, DO OB Fellow, Faculty [facility], Center for [facility] 10/15/2019 2:37 PM

## 2019-10-15 NOTE — Discharge Instructions (Signed)

## 2019-10-15 NOTE — Progress Notes (Signed)
RN requested FSE due to difficulty tracing, IUPC and amnioinfusion 2/2 decels (?variable v early)  SVE: 7/90/0 FHR: 125, mod var, 15x15 accels, occasional decels (?variable v early)  A/p: FSE and IUPC placed, amnioinfusion started Making good progress without augmentation 2nd dose of PCN in Anticipate NSVD  Najma Bozarth, Margarette Asal, DO OB Fellow, Faculty Practice 10/15/2019 12:05 PM

## 2019-10-15 NOTE — Progress Notes (Addendum)
Labor Progress Note Kristen [name] is a 29 y.o. G2P1001 at [redacted]w[redacted]d presented for PROM @0500  S: Patient feeling pressure, urge to have bowel movement  O:  BP (!) 114/59   Pulse (!) 111   Temp 98 F (36.7 C) (Oral)   Resp 18   Ht 5\' 5"  (1.651 m)   Wt 108 kg   LMP 01/12/2019   SpO2 100%   BMI 39.61 kg/m  EFM: baseline 125/moderate variability/+accels, variable decels  CVE: Dilation: 8 Effacement (%): 90 Station: 0 Presentation: Vertex Exam by:: CWhite,RNC   A&P: 29 y.o. G2P1001 [redacted]w[redacted]d   #Labor: On Pit at 48mL/hour, patient began to have prolonged decel (3 min total) FHR dropped to 70's, re-positioned to hands and knees. Pit turned off briefly @1809 . FHR improved back to baseline 120's. Pit restarted at 1822 at 9mL/hr #Pain: Comfortable with epidural #FWB: Cat II, reassuring due to accelerations and moderate variability #GBS positive on PCN    Alejandra Espinoza, DO 6:18 PM  GME ATTESTATION:  I saw and evaluated the patient. I agree with the findings and the plan of care as documented in the resident's note.  11m, DO OB Fellow, Faculty [facility], Center for [facility] 10/15/2019 6:40 PM

## 2019-10-15 NOTE — Progress Notes (Addendum)
Labor Progress Note Kristen [name] is a 29 y.o. G2P1001 at [redacted]w[redacted]d presented for PROM @0500   S: Still feeling pressure  O:  BP 116/65   Pulse 92   Temp 98.1 F (36.7 C) (Oral)   Resp 16   Ht 5\' 5"  (1.651 m)   Wt 108 kg   LMP 01/12/2019   SpO2 96%   BMI 39.61 kg/m  EFM: baseline 120/moderate variability/+accels, subtle decels  CVE: Dilation: 8 Effacement (%): 90 Station: 0 Presentation: Vertex Exam by:: Dr. 01/14/2019   A&P: 29 y.o. G2P1001 [redacted]w[redacted]d  #Labor: Prolonged deceleration, FHR to 60's. Turned off Pit, gave terbutaline. Transitioned patient to multiple different positions. Now on hands and knees. Dr. 34 at bedside. FHR came back up to 130's, EFM with accelerations and only subtle decels with position change. Plan to keep in this position for 20 minutes and then lay on patients left side. #[name]: per patient request #FWB: Improved with hands and knees positioning. #GBS positive on PCN  [redacted]w[redacted]d, DO 3:17 PM  GME ATTESTATION:  I saw and evaluated the patient. I agree with the findings and the plan of care as documented in the resident's note.  Alysia Penna, DO OB Fellow, Faculty [facility], Center for [facility] 10/15/2019 3:38 PM

## 2019-10-16 DIAGNOSIS — R030 Elevated blood-pressure reading, without diagnosis of hypertension: Secondary | ICD-10-CM

## 2019-10-16 LAB — COMPREHENSIVE METABOLIC PANEL
ALT: 11 U/L (ref 0–44)
AST: 24 U/L (ref 15–41)
Albumin: 2.3 g/dL — ABNORMAL LOW (ref 3.5–5.0)
Alkaline Phosphatase: 106 U/L (ref 38–126)
Anion gap: 8 (ref 5–15)
BUN: <5 mg/dL — ABNORMAL LOW (ref 6–20)
CO2: 20 mmol/L — ABNORMAL LOW (ref 22–32)
Calcium: 8.8 mg/dL — ABNORMAL LOW (ref 8.9–10.3)
Chloride: 109 mmol/L (ref 98–111)
Creatinine, Ser: 0.93 mg/dL (ref 0.44–1.00)
GFR calc Af Amer: >60 mL/min (ref >60)
GFR calc non Af Amer: >60 mL/min (ref >60)
Glucose, Bld: 112 mg/dL — ABNORMAL HIGH (ref 70–99)
Potassium: 3.6 mmol/L (ref 3.5–5.1)
Sodium: 137 mmol/L (ref 135–145)
Total Bilirubin: 0.8 mg/dL (ref 0.3–1.2)
Total Protein: 5.6 g/dL — ABNORMAL LOW (ref 6.5–8.1)

## 2019-10-16 MED ORDER — IBUPROFEN 600 MG PO TABS
600.0000 mg | ORAL_TABLET | Freq: Four times a day (QID) | ORAL | Status: DC
  Administered 2019-10-16 – 2019-10-17 (×6): 600 mg via ORAL
  Filled 2019-10-16 (×5): qty 1

## 2019-10-16 MED ORDER — PRENATAL MULTIVITAMIN CH
1.0000 | ORAL_TABLET | Freq: Every day | ORAL | Status: DC
  Administered 2019-10-16 – 2019-10-17 (×2): 1 via ORAL
  Filled 2019-10-16: qty 1

## 2019-10-16 MED ORDER — ONDANSETRON HCL 4 MG/2ML IJ SOLN: 4.0000 mg | INTRAMUSCULAR | Status: DC | PRN

## 2019-10-16 MED ORDER — AMLODIPINE BESYLATE 5 MG PO TABS
5.0000 mg | ORAL_TABLET | Freq: Every day | ORAL | Status: DC
  Administered 2019-10-16 – 2019-10-17 (×2): 5 mg via ORAL
  Filled 2019-10-16 (×2): qty 1

## 2019-10-16 MED ORDER — SIMETHICONE 80 MG PO CHEW: 80.0000 mg | CHEWABLE_TABLET | ORAL | Status: DC | PRN

## 2019-10-16 MED ORDER — BENZOCAINE-MENTHOL 20-0.5 % EX AERO: 1.0000 "application " | INHALATION_SPRAY | CUTANEOUS | Status: DC | PRN

## 2019-10-16 MED ORDER — COCONUT OIL OIL: 1.0000 "application " | TOPICAL_OIL | Status: DC | PRN

## 2019-10-16 MED ORDER — ONDANSETRON HCL 4 MG PO TABS: 4.0000 mg | ORAL_TABLET | ORAL | Status: DC | PRN

## 2019-10-16 MED ORDER — DIBUCAINE (PERIANAL) 1 % EX OINT: 1.0000 "application " | TOPICAL_OINTMENT | CUTANEOUS | Status: DC | PRN

## 2019-10-16 MED ORDER — ACETAMINOPHEN 325 MG PO TABS: 650.0000 mg | ORAL_TABLET | ORAL | Status: DC | PRN

## 2019-10-16 MED ORDER — MAGNESIUM HYDROXIDE 400 MG/5ML PO SUSP: 30.0000 mL | ORAL | Status: DC | PRN

## 2019-10-16 MED ORDER — DIPHENHYDRAMINE HCL 25 MG PO CAPS: 25.0000 mg | ORAL_CAPSULE | Freq: Four times a day (QID) | ORAL | Status: DC | PRN

## 2019-10-16 MED ORDER — WITCH HAZEL-GLYCERIN EX PADS: 1.0000 "application " | MEDICATED_PAD | CUTANEOUS | Status: DC | PRN

## 2019-10-16 MED ORDER — SENNOSIDES-DOCUSATE SODIUM 8.6-50 MG PO TABS
2.0000 | ORAL_TABLET | ORAL | Status: DC
  Administered 2019-10-16 – 2019-10-17 (×2): 2 via ORAL
  Filled 2019-10-16 (×2): qty 2

## 2019-10-16 NOTE — Lactation Note (Signed)
This note was copied from a baby's chart. Lactation Consultation Note  Patient Name: Kristen [name] NOMVE'H Date: 10/16/2019 Reason for consult: Initial assessment;Term  Visited with mom of a 39 hours old FT female; she's a P2. She BF her first child for 8 months but reported some BF difficulties. Mom voiced that her first baby would only latch on one breast and she'll have to pump the other one. She has a Medela DEBP at home. She participated in the [facility] program [facility] the [facility], and she's already familiar with hand expression. When [facility] assisted with hand expression, noticed that nipples are semi-flat/short shafted, mom has large breast but her tissue is very compressible, colostrum was easily expressed, praised her for her efforts.  Offered assistance with latch and mom agreed to wake baby up to feed, he hasn't fed since 9 am this morning. Reminded mom about the importance of offering the breast and have feeding attempts 8-12 times/24 hours. LC checked baby's diaper and changed it, 1 stool and 1 void were recorded. Baby still sleepy after diaper change.  LC took him STS to mother's left breast but he wasn't able to latch. Baby kept sucking on his tongue, if still not latching after the 24 hours mark baby may need another oral assessment. LC placed baby STS after feeding attempt; he was still awake. LC assisted mom with more hand expression and spoon fed baby 1 ml of colostrum. Reviewed normal newborn behavior, cluster feeding, feeding cues, lactogenesis II and size of baby's stomach.  Feeding plan:  1. Encouraged mom to keep putting baby to breast 8-12 times/24 hours or sooner if feeding cues are present 2. Hand expression and spoon feeding was also encouraged; especially if baby is still having difficulty latching on. 3. Mom will also consider pumping at the hospital, will revise tomorrow  BF brochure, BF resources and feeding diary were reviewed. No support person in mom's room at the time of [facility]  consultation. Mom reported all questions and concerns were answered, she's aware of LC OP services and will call PRN.   Maternal Data Formula Feeding for Exclusion: No Has patient been taught Hand Expression?: Yes Does the patient have breastfeeding experience prior to this delivery?: Yes  Feeding Feeding Type: Breast Fed  LATCH Score                   Interventions Interventions: Breast feeding basics reviewed;Skin to skin;Breast massage;Hand express;Assisted with latch;Breast compression;Adjust position;Support pillows  Lactation Tools Discussed/Used WIC Program: Yes   Consult Status Consult Status: Follow-up Date: 10/17/19 Follow-up type: In-patient    Kristen [name] 10/16/2019, 4:16 PM

## 2019-10-16 NOTE — Progress Notes (Signed)
Post Partum Day 1 Subjective: Doing well without complaints. Ambulating. Urinating/passing flatus. Tolerating PO. Pain well controlled. VB improved. Denies vision changes, headaches, cp, sob, le edema.  Objective: Blood pressure 131/77, pulse 80, temperature 98.4 F (36.9 C), temperature source Oral, resp. rate 17, height 5\' 5"  (1.651 m), weight 108 kg, last menstrual period 01/12/2019, SpO2 99 %, unknown if currently breastfeeding.  Physical Exam:  General: alert, cooperative and no distress Lochia: appropriate Uterine Fundus: firm Incision: n/a DVT Evaluation: No evidence of DVT seen on physical exam.  Recent Labs    10/15/19 0619  HGB 10.4*  HCT 33.9*    Assessment/Plan:  PP#1 uncomplicated SVD  -meeting milestones  -breastfeeding  -desires circ, ordered/consented  -OCPs  #Elevated blood pressure: no prior diagnosis of htn during/outside of pregnancy. States she did have elevated BP with her last pregnancy and required intrapartum anti-hypertensive. Currently asymptomatic. CBC yesterday demonstrated nml platelets. Given multiple elevated BP readings will initiate amlodipine 5 mg daily at this time. CMP pending.   Plan for discharge tomorrow.   LOS: 1 day   10/17/19 10/16/2019, 5:53 AM

## 2019-10-16 NOTE — Anesthesia Postprocedure Evaluation (Signed)
Anesthesia Post Note  Patient: Kristen [name]  Procedure(s) Performed: AN AD HOC LABOR EPIDURAL     Patient location during evaluation: Mother Baby Anesthesia Type: Epidural Level of consciousness: awake, awake and alert and oriented Pain management: pain level controlled Vital Signs Assessment: post-procedure vital signs reviewed and stable Respiratory status: spontaneous breathing and respiratory function stable Cardiovascular status: blood pressure returned to baseline Postop Assessment: no headache, epidural receding, patient able to bend at knees, adequate PO intake, no backache, no apparent nausea or vomiting and able to ambulate Anesthetic complications: no   No complications documented.  Last Vitals:  Vitals:   10/16/19 0535 10/16/19 0852  BP: 131/77 118/77  Pulse: 80 81  Resp:    Temp: 36.9 C 36.8 C  SpO2: 99%     Last Pain:  Vitals:   10/16/19 0852  TempSrc: Oral  PainSc:    Pain Goal: Patients Stated Pain Goal: 4 (10/15/19 0737)              Epidural/Spinal Function Cutaneous sensation: Normal sensation (10/16/19 0850)  Cleda Clarks

## 2019-10-17 ENCOUNTER — Encounter: Payer: Medicaid Other | Admitting: Family Medicine

## 2019-10-17 LAB — RPR: RPR Ser Ql: NONREACTIVE — AB

## 2019-10-17 MED ORDER — IBUPROFEN 600 MG PO TABS: 600.0000 mg | ORAL_TABLET | Freq: Four times a day (QID) | ORAL | 0 refills | Status: DC

## 2019-10-17 MED ORDER — PRENATAL MULTIVITAMIN CH: 1.0000 | ORAL_TABLET | Freq: Every day | ORAL | 2 refills | Status: DC

## 2019-10-17 MED ORDER — NORETHINDRONE 0.35 MG PO TABS
1.0000 | ORAL_TABLET | Freq: Every day | ORAL | 11 refills | Status: DC
Start: 2019-10-17 — End: 2019-11-25

## 2019-10-17 MED ORDER — ACETAMINOPHEN 500 MG PO TABS: 1000.0000 mg | ORAL_TABLET | Freq: Four times a day (QID) | ORAL | 0 refills | Status: DC | PRN

## 2019-10-17 MED ORDER — AMLODIPINE BESYLATE 5 MG PO TABS: 5.0000 mg | ORAL_TABLET | Freq: Every day | ORAL | 0 refills | Status: DC

## 2019-10-17 MED ORDER — POLYETHYLENE GLYCOL 3350 17 G PO PACK: 17.0000 g | PACK | Freq: Every day | ORAL | 0 refills | Status: DC

## 2019-10-17 NOTE — Lactation Note (Signed)
This note was copied from a baby's chart. Lactation Consultation Note  Patient Name: Boy Karolyna Bianchini KGURK'Y Date: 10/17/2019 Reason for consult: Follow-up assessment   Mother reports that breastfeeding is going well. She denies having any concerns or questions.  Discussed engorgement prevention and treatment with mother .  Mother advised to follow up with [facility] services as needed.   Maternal Data    Feeding    LATCH Score                   Interventions    Lactation Tools Discussed/Used     Consult Status Consult Status: Complete    Michel Bickers 10/17/2019, 11:59 AM

## 2019-10-21 ENCOUNTER — Other Ambulatory Visit (HOSPITAL_COMMUNITY): Payer: Medicaid Other

## 2019-10-22 ENCOUNTER — Inpatient Hospital Stay (HOSPITAL_COMMUNITY): Admission: AD | Admit: 2019-10-22 | Payer: Medicaid Other | Source: Home / Self Care | Admitting: Family Medicine

## 2019-10-22 ENCOUNTER — Inpatient Hospital Stay (HOSPITAL_COMMUNITY): Payer: Medicaid Other

## 2019-10-24 ENCOUNTER — Ambulatory Visit (INDEPENDENT_AMBULATORY_CARE_PROVIDER_SITE_OTHER): Payer: Medicaid Other

## 2019-10-24 ENCOUNTER — Other Ambulatory Visit: Payer: Self-pay

## 2019-10-24 VITALS — BP 132/90 | HR 83 | Ht 65.0 in | Wt 220.0 lb

## 2019-10-24 DIAGNOSIS — R030 Elevated blood-pressure reading, without diagnosis of hypertension: Secondary | ICD-10-CM

## 2019-10-24 NOTE — Progress Notes (Signed)
Patient is nine days postpartum. Patient presents for BP check. Armandina Stammer RN

## 2019-11-25 ENCOUNTER — Other Ambulatory Visit: Payer: Self-pay

## 2019-11-25 ENCOUNTER — Ambulatory Visit (INDEPENDENT_AMBULATORY_CARE_PROVIDER_SITE_OTHER): Payer: Medicaid Other | Admitting: Family Medicine

## 2019-11-25 MED ORDER — NORETHINDRONE 0.35 MG PO TABS
1.0000 | ORAL_TABLET | Freq: Every day | ORAL | 3 refills | Status: DC
Start: 2019-11-25 — End: 2020-01-13

## 2019-11-25 NOTE — Progress Notes (Signed)
    Post Partum Visit Note  Kristen [name] is a 29 y.o. G3P2002 female who presents for a postpartum visit. She is 5 weeks postpartum following a normal spontaneous vaginal delivery.  I have fully reviewed the prenatal and intrapartum course. The delivery was at 39.3 gestational weeks.  Anesthesia: epidural. Postpartum course has been uneventful. Baby is doing well. Baby is feeding by breast. Bleeding no bleeding. Bowel function is normal. Bladder function is normal. Patient is not sexually active. Contraception method is oral progesterone-only contraceptive. Postpartum depression screening: negative.   The pregnancy intention screening data noted above was reviewed. Potential methods of contraception were discussed. The patient elected to proceed with Oral Contraceptive. progestrone only.  Edinburgh Postnatal Depression Scale - 11/25/19 1033      Edinburgh Postnatal Depression Scale:  In the Past 7 Days   I have been able to laugh and see the funny side of things. 0    I have looked forward with enjoyment to things. 0    I have blamed myself unnecessarily when things went wrong. 0    I have been anxious or worried for no good reason. 0    I have felt scared or panicky for no good reason. 0    Things have been getting on top of me. 0    I have been so unhappy that I have had difficulty sleeping. 0    I have felt sad or miserable. 0    I have been so unhappy that I have been crying. 0    The thought of harming myself has occurred to me. 0    Edinburgh Postnatal Depression Scale Total 0             Review of Systems Pertinent items are noted in HPI.    Objective:  Blood pressure 114/77, pulse 79, height 5\' 5"  (1.651 m), weight 215 lb (97.5 kg), last menstrual period 01/12/2019, currently breastfeeding.  General:  alert, cooperative and no distress  Lungs: clear to auscultation bilaterally  Heart:  regular rate and rhythm, S1, S2 normal, no murmur, click, rub or gallop  Abdomen:  soft, non-tender; bowel sounds normal; no masses,  no organomegaly        Assessment:    Normal postpartum exam. Pap smear not done at today's visit.   Plan:   Essential components of care per ACOG recommendations:  1.  Mood and well being: Patient with negative depression screening today. Reviewed local resources for support.  - Patient does not use tobacco.  - hx of drug use? No    2. Infant care and feeding:  -Patient currently breastmilk feeding? Yes  -Social determinants of health (SDOH) reviewed in EPIC. No concerns  3. Sexuality, contraception and birth spacing - Patient does not want a pregnancy in the next year.   - Reviewed forms of contraception in tiered fashion. Patient desired oral progesterone-only contraceptive today.   - Discussed birth spacing of 18 months  4. Sleep and fatigue -Encouraged family/partner/community support of 4 hrs of uninterrupted sleep to help with mood and fatigue  5. Physical Recovery  - Discussed patients delivery and complications - Patient had a 1st degree laceration, perineal healing reviewed. Patient expressed understanding - Patient has urinary incontinence? No  - Patient is safe to resume physical and sexual activity  6.  Health Maintenance - Last pap smear done 03/14/2019  and was normal.   03/16/2019, DO Center for [facility], [facility]

## 2020-01-13 ENCOUNTER — Other Ambulatory Visit: Payer: Self-pay

## 2020-01-13 MED ORDER — NORETHINDRONE 0.35 MG PO TABS: 1.0000 | ORAL_TABLET | Freq: Every day | ORAL | 3 refills | Status: DC

## 2021-01-13 ENCOUNTER — Other Ambulatory Visit: Payer: Self-pay

## 2021-01-13 MED ORDER — NORETHINDRONE 0.35 MG PO TABS: 1.0000 | ORAL_TABLET | Freq: Every day | ORAL | 3 refills | Status: DC

## 2021-01-19 ENCOUNTER — Other Ambulatory Visit: Payer: Self-pay | Admitting: Family Medicine

## 2021-02-11 ENCOUNTER — Other Ambulatory Visit: Payer: Self-pay

## 2021-02-11 MED ORDER — NORETHINDRONE 0.35 MG PO TABS: 1.0000 | ORAL_TABLET | Freq: Every day | ORAL | 3 refills | Status: DC

## 2021-02-28 NOTE — L&D Delivery Note (Signed)
Delivery Note Kristen [name] is a 31 y.o. G3P2002 at [redacted]w[redacted]d admitted for SROM.   GBS Status: Positive/-- (12/12 0900) Maximum Maternal Temperature: 98.4  Labor course: Initial SVE: 1.5/50/-2. Augmentation with: N/A. She then progressed to complete.  ROM: 5h 34m with clear fluid  Birth: At 1123 a viable female was delivered via spontaneous vaginal delivery (Presentation: cephalic;LOA). Nuchal cord present: Yes, easily reduced. Shoulders and body delivered in usual fashion. Infant placed directly on mom's abdomen for bonding/skin-to-skin, baby dried and stimulated. Cord clamped x 2 after 1 minute and cut by patient. Cord blood collected.  The placenta separated spontaneously and delivered via gentle cord traction. Pitocin infused rapidly IV per protocol.  Fundus firm with massage.  Placenta inspected and appears to be intact with a 3 VC.  Placenta/Cord with the following complications: none. Cord pH: none Sponge and instrument count were correct x2.  Intrapartum complications:  None Anesthesia:  epidural Episiotomy: none Lacerations:  none Suture Repair: n/a EBL (mL): 56   Infant: APGAR (1 MIN):  8 APGAR (5 MINS):  9 Infant weight: pending  Mom to postpartum.  Baby to Couplet care / Skin to Skin. Placenta to L&D   Plans to Breastfeed Contraception: Depo-Provera injections Circumcision: wants inpatient  Note sent to [facility]: [facility] for pp visit.  Brand Males, CNM 02/23/2022 11:37 AM

## 2021-07-07 ENCOUNTER — Ambulatory Visit (INDEPENDENT_AMBULATORY_CARE_PROVIDER_SITE_OTHER): Payer: Medicaid Other

## 2021-07-07 VITALS — BP 128/76 | HR 71 | Wt 237.0 lb

## 2021-07-07 DIAGNOSIS — Z3201 Encounter for pregnancy test, result positive: Secondary | ICD-10-CM | POA: Diagnosis not present

## 2021-07-07 LAB — POCT URINE PREGNANCY: Preg Test, Ur: POSITIVE — AB

## 2021-07-07 MED ORDER — PRENATAL VITAMIN 27-0.8 MG PO TABS: ORAL_TABLET | ORAL | 11 refills | Status: DC

## 2021-07-07 NOTE — Addendum Note (Signed)
Addended by: Wendelyn Breslow L on: 07/07/2021 10:27 AM ? ? Modules accepted: Orders ? ?

## 2021-07-07 NOTE — Progress Notes (Signed)
Pt presents for UPT. UPT positive.Pt states LMP was approximately 05/12/2021. Pt is scheduled for a NOB appt on 08/24/21. PNV were sent to her pharmacy. ?Yoshiko Keleher l Atira Borello, CMA  ?

## 2021-07-07 NOTE — Progress Notes (Deleted)
Pt presents for UPT. UPT positive.Pt states LMP was approximately 05/12/2021. Pt is scheduled for a NOB appt on 08/24/21. PNV were sent to her pharmacy. ?Sundai Probert l Prayan Ulin, CMA  ?

## 2021-07-12 ENCOUNTER — Inpatient Hospital Stay (HOSPITAL_COMMUNITY)
Admission: AD | Admit: 2021-07-12 | Discharge: 2021-07-12 | Disposition: A | Discharge status: Home / Self Care | Payer: Medicaid Other | Attending: Obstetrics & Gynecology | Admitting: Obstetrics & Gynecology

## 2021-07-12 ENCOUNTER — Encounter (HOSPITAL_COMMUNITY): Payer: Self-pay | Admitting: *Deleted

## 2021-07-12 DIAGNOSIS — O26891 Other specified pregnancy related conditions, first trimester: Secondary | ICD-10-CM | POA: Insufficient documentation

## 2021-07-12 DIAGNOSIS — R42 Dizziness and giddiness: Secondary | ICD-10-CM | POA: Insufficient documentation

## 2021-07-12 DIAGNOSIS — Z3A08 8 weeks gestation of pregnancy: Secondary | ICD-10-CM | POA: Diagnosis not present

## 2021-07-12 DIAGNOSIS — O219 Vomiting of pregnancy, unspecified: Secondary | ICD-10-CM | POA: Diagnosis not present

## 2021-07-12 DIAGNOSIS — Z3A01 Less than 8 weeks gestation of pregnancy: Secondary | ICD-10-CM | POA: Diagnosis not present

## 2021-07-12 LAB — CBC
HCT: 39.1 % (ref 36.0–46.0)
Hemoglobin: 13.6 g/dL (ref 12.0–15.0)
MCH: 29.4 pg (ref 26.0–34.0)
MCHC: 34.8 g/dL (ref 30.0–36.0)
MCV: 84.4 fL (ref 80.0–100.0)
Platelets: 301 10*3/uL (ref 150–400)
RBC: 4.63 MIL/uL (ref 3.87–5.11)
RDW: 14 % (ref 11.5–15.5)
WBC: 9.7 10*3/uL (ref 4.0–10.5)
nRBC: 0 % (ref 0.0–0.2)

## 2021-07-12 LAB — URINALYSIS, ROUTINE W REFLEX MICROSCOPIC
Bacteria, UA: NONE SEEN
Bilirubin Urine: NEGATIVE
Glucose, UA: NEGATIVE mg/dL
Hgb urine dipstick: NEGATIVE
Ketones, ur: NEGATIVE mg/dL
Nitrite: NEGATIVE
Protein, ur: NEGATIVE mg/dL
Specific Gravity, Urine: 1.01 (ref 1.005–1.030)
pH: 6 (ref 5.0–8.0)

## 2021-07-12 LAB — COMPREHENSIVE METABOLIC PANEL
ALT: 12 U/L (ref 0–44)
AST: 17 U/L (ref 15–41)
Albumin: 3.5 g/dL (ref 3.5–5.0)
Alkaline Phosphatase: 62 U/L (ref 38–126)
Anion gap: 6 (ref 5–15)
BUN: 6 mg/dL (ref 6–20)
CO2: 22 mmol/L (ref 22–32)
Calcium: 9.2 mg/dL (ref 8.9–10.3)
Chloride: 106 mmol/L (ref 98–111)
Creatinine, Ser: 0.79 mg/dL (ref 0.44–1.00)
GFR, Estimated: >60 mL/min (ref >60)
Glucose, Bld: 87 mg/dL (ref 70–99)
Potassium: 3.9 mmol/L (ref 3.5–5.1)
Sodium: 134 mmol/L — ABNORMAL LOW (ref 135–145)
Total Bilirubin: 0.7 mg/dL (ref 0.3–1.2)
Total Protein: 7.1 g/dL (ref 6.5–8.1)

## 2021-07-12 MED ORDER — ONDANSETRON 4 MG PO TBDP: 4.0000 mg | ORAL_TABLET | Freq: Four times a day (QID) | ORAL | 0 refills | Status: DC | PRN

## 2021-07-12 MED ORDER — LACTATED RINGERS IV BOLUS: 1000.0000 mL | Freq: Once | INTRAVENOUS | Status: DC

## 2021-07-12 MED ORDER — DOXYLAMINE-PYRIDOXINE 10-10 MG PO TBEC: 2.0000 | DELAYED_RELEASE_TABLET | Freq: Every day | ORAL | 5 refills | Status: DC

## 2021-07-12 MED ORDER — ONDANSETRON HCL 4 MG/2ML IJ SOLN: 4.0000 mg | Freq: Once | INTRAMUSCULAR | Status: DC

## 2021-07-12 NOTE — MAU Note (Signed)
Kristen [name] is a 31 y.o. at [redacted]w[redacted]d here in MAU reporting: today, she was doing ok.  She works at home, but after a couple hours she started getting real light headed and dizzy, feels nauseous.  Has been really nauseous since she found she was pregnant.  Did not mention when she went in for confirmation. Has not thrown up today. Not as dizzy now, comes and goes. Has a few time, times, but is mostly just nauseated. Denies bleeding or abd pain.  ? ?Onset of complaint: nausea for about a wk ?Pain score: none ?Vitals:  ? 07/12/21 1726  ?BP: 134/74  ?Pulse: 86  ?Resp: 18  ?Temp: 99.6 ?F (37.6 ?C)  ?SpO2: 99%  ?   ? ?Lab orders placed from triage:  urine ?

## 2021-07-12 NOTE — MAU Provider Note (Signed)
?History  ?  ? ?CSN: [id number] ? ?Arrival date and time: 07/12/21 1644 ? ? ? ?Chief Complaint  ?Patient presents with  ? Nausea  ? Dizziness  ? ?HPI ?Is a 31 year old G3 P2-0-0-2 at 8 weeks and 5 days by uncertain LMP who presents with nausea and vomiting.  She also began to feel dizzy earlier today, but this is resolved since her arrival here.  She has had a lot of nausea but has not been intolerant of food and liquids.  Her nausea has been fairly constant ? ?OB History   ? ? Gravida  ?3  ? Para  ?2  ? Term  ?2  ? Preterm  ?0  ? AB  ?0  ? Living  ?2  ?  ? ? SAB  ?0  ? IAB  ?0  ? Ectopic  ?0  ? Multiple  ?0  ? Live Births  ?2  ?   ?  ?  ? ? ?Past Medical History:  ?Diagnosis Date  ? Bacterial vaginitis 07/01/2016  ? Medical history non-contributory   ? ? ?Past Surgical History:  ?Procedure Laterality Date  ? NO PAST SURGERIES    ? ? ?Family History  ?Problem Relation Age of Onset  ? Heart disease Father   ? ? ?Social History  ? ?Tobacco Use  ? Smoking status: Never  ? Smokeless tobacco: Never  ?Vaping Use  ? Vaping Use: Never used  ?Substance Use Topics  ? Alcohol use: Yes  ?  Comment: occ  ? Drug use: No  ? ? ?Allergies: No Known Allergies ? ?Medications Prior to Admission  ?Medication Sig Dispense Refill Last Dose  ? Prenatal Vit-Fe Fumarate-FA (PRENATAL VITAMIN) 27-0.8 MG TABS Take 1 tablet by mouth daily. 30 tablet 11 07/12/2021  ? norethindrone (MICRONOR) 0.35 MG tablet Take 1 tablet (0.35 mg total) by mouth daily. (Patient not taking: Reported on 07/07/2021) 84 tablet 3   ? Prenatal Vit-Fe Fumarate-FA (PRENATAL MULTIVITAMIN) TABS tablet Take 1 tablet by mouth daily at 12 noon. (Patient not taking: Reported on 07/07/2021) 30 tablet 2   ? ? ?Review of Systems ?Physical Exam  ? ?Blood pressure 134/74, pulse 86, temperature 99.6 ?F (37.6 ?C), temperature source Oral, resp. rate 18, height 5\' 5"  (1.651 m), weight 108.6 kg, last menstrual period 05/12/2021, SpO2 99 %, currently breastfeeding. ? ?Physical Exam ?Vitals  reviewed.  ?Constitutional:   ?   Appearance: Normal appearance.  ?Cardiovascular:  ?   Rate and Rhythm: Normal rate and regular rhythm.  ?Pulmonary:  ?   Effort: Pulmonary effort is normal.  ?Abdominal:  ?   General: Abdomen is flat. There is no distension.  ?   Palpations: Abdomen is soft.  ?   Tenderness: There is no abdominal tenderness.  ?Skin: ?   General: Skin is warm and dry.  ?Neurological:  ?   Mental Status: She is alert.  ?Psychiatric:     ?   Mood and Affect: Mood normal.     ?   Behavior: Behavior normal.     ?   Thought Content: Thought content normal.     ?   Judgment: Judgment normal.  ? ?Results for orders placed or performed during the hospital encounter of 07/12/21 (from the past 24 hour(s))  ?CBC     Status: None  ? Collection Time: 07/12/21  5:55 PM  ?Result Value Ref Range  ? WBC 9.7 4.0 - 10.5 K/uL  ? RBC 4.63 3.87 - 5.11 MIL/uL  ?  Hemoglobin 13.6 12.0 - 15.0 g/dL  ? HCT 39.1 36.0 - 46.0 %  ? MCV 84.4 80.0 - 100.0 fL  ? MCH 29.4 26.0 - 34.0 pg  ? MCHC 34.8 30.0 - 36.0 g/dL  ? RDW 14.0 11.5 - 15.5 %  ? Platelets 301 150 - 400 K/uL  ? nRBC 0.0 0.0 - 0.2 %  ?Comprehensive metabolic panel     Status: Abnormal  ? Collection Time: 07/12/21  5:55 PM  ?Result Value Ref Range  ? Sodium 134 (L) 135 - 145 mmol/L  ? Potassium 3.9 3.5 - 5.1 mmol/L  ? Chloride 106 98 - 111 mmol/L  ? CO2 22 22 - 32 mmol/L  ? Glucose, Bld 87 70 - 99 mg/dL  ? BUN 6 6 - 20 mg/dL  ? Creatinine, Ser 0.79 0.44 - 1.00 mg/dL  ? Calcium 9.2 8.9 - 10.3 mg/dL  ? Total Protein 7.1 6.5 - 8.1 g/dL  ? Albumin 3.5 3.5 - 5.0 g/dL  ? AST 17 15 - 41 U/L  ? ALT 12 0 - 44 U/L  ? Alkaline Phosphatase 62 38 - 126 U/L  ? Total Bilirubin 0.7 0.3 - 1.2 mg/dL  ? GFR, Estimated >60 >60 mL/min  ? Anion gap 6 5 - 15  ?Urinalysis, Routine w reflex microscopic Urine, Clean Catch     Status: Abnormal  ? Collection Time: 07/12/21  6:07 PM  ?Result Value Ref Range  ? Color, Urine YELLOW YELLOW  ? APPearance HAZY (A) CLEAR  ? Specific Gravity, Urine 1.010  1.005 - 1.030  ? pH 6.0 5.0 - 8.0  ? Glucose, UA NEGATIVE NEGATIVE mg/dL  ? Hgb urine dipstick NEGATIVE NEGATIVE  ? Bilirubin Urine NEGATIVE NEGATIVE  ? Ketones, ur NEGATIVE NEGATIVE mg/dL  ? Protein, ur NEGATIVE NEGATIVE mg/dL  ? Nitrite NEGATIVE NEGATIVE  ? Leukocytes,Ua SMALL (A) NEGATIVE  ? RBC / HPF 0-5 0 - 5 RBC/hpf  ? WBC, UA 0-5 0 - 5 WBC/hpf  ? Bacteria, UA NONE SEEN NONE SEEN  ? Squamous Epithelial / LPF 0-5 0 - 5  ? Mucus PRESENT   ? Hyaline Casts, UA PRESENT   ? Non Squamous Epithelial 0-5 (A) NONE SEEN  ? ? ? ?MAU Course  ?Procedures ? ?Bedside ultrasound done patient uncertain of dates.  Intrauterine pregnancy with crown-rump length 6 weeks and 1 day with fetal cardiac activity of 118.  No subchorionic hematoma ? ?MDM ? ? ?Assessment and Plan  ? ?1. [redacted] weeks gestation of pregnancy   ?2. Nausea and vomiting in pregnancy   ? ?We will prescribe Zofran and Diclegis.  Discharge to home ? ?Levie Heritage ?07/12/2021, 7:42 PM  ?

## 2021-07-29 ENCOUNTER — Inpatient Hospital Stay (HOSPITAL_COMMUNITY)
Admission: AD | Admit: 2021-07-29 | Discharge: 2021-07-29 | Disposition: A | Discharge status: Home / Self Care | Payer: Medicaid Other | Attending: Obstetrics and Gynecology | Admitting: Obstetrics and Gynecology

## 2021-07-29 ENCOUNTER — Inpatient Hospital Stay (HOSPITAL_COMMUNITY): Payer: Medicaid Other

## 2021-07-29 DIAGNOSIS — Z3491 Encounter for supervision of normal pregnancy, unspecified, first trimester: Secondary | ICD-10-CM | POA: Diagnosis not present

## 2021-07-29 DIAGNOSIS — Z3A08 8 weeks gestation of pregnancy: Secondary | ICD-10-CM | POA: Insufficient documentation

## 2021-07-29 DIAGNOSIS — O209 Hemorrhage in early pregnancy, unspecified: Secondary | ICD-10-CM | POA: Diagnosis present

## 2021-07-29 LAB — CBC
HCT: 37.5 % (ref 36.0–46.0)
Hemoglobin: 12.7 g/dL (ref 12.0–15.0)
MCH: 29 pg (ref 26.0–34.0)
MCHC: 33.9 g/dL (ref 30.0–36.0)
MCV: 85.6 fL (ref 80.0–100.0)
Platelets: 277 10*3/uL (ref 150–400)
RBC: 4.38 MIL/uL (ref 3.87–5.11)
RDW: 13.7 % (ref 11.5–15.5)
WBC: 8.1 10*3/uL (ref 4.0–10.5)
nRBC: 0 % (ref 0.0–0.2)

## 2021-07-29 LAB — URINALYSIS, ROUTINE W REFLEX MICROSCOPIC
Bilirubin Urine: NEGATIVE
Glucose, UA: NEGATIVE mg/dL
Ketones, ur: 5 mg/dL — AB
Nitrite: NEGATIVE
Protein, ur: NEGATIVE mg/dL
Specific Gravity, Urine: 1.014 (ref 1.005–1.030)
pH: 6 (ref 5.0–8.0)

## 2021-07-29 LAB — WET PREP, GENITAL
Sperm: NONE SEEN
Trich, Wet Prep: NONE SEEN
WBC, Wet Prep HPF POC: <10 (ref <10)
Yeast Wet Prep HPF POC: NONE SEEN

## 2021-07-29 LAB — HCG, QUANTITATIVE, PREGNANCY: hCG, Beta Chain, Quant, S: 125241 m[IU]/mL — ABNORMAL HIGH (ref <5)

## 2021-07-29 NOTE — MAU Note (Signed)
Pt says has pink Vag bleeding when she wipes- started 8pm Last sex- April [facility]- has an appointment on 6-27- with Dr Adrian Blackwater Had slight cramps at 8pm- but none now.

## 2021-07-29 NOTE — Discharge Instructions (Signed)

## 2021-07-30 LAB — GC/CHLAMYDIA PROBE AMP (~~LOC~~) NOT AT ARMC
Chlamydia: NEGATIVE
Comment: NEGATIVE
Comment: NORMAL
Neisseria Gonorrhea: NEGATIVE

## 2021-08-24 ENCOUNTER — Other Ambulatory Visit (HOSPITAL_COMMUNITY)
Admission: RE | Admit: 2021-08-24 | Discharge: 2021-08-24 | Disposition: A | Discharge status: Home / Self Care | Payer: Medicaid Other | Source: Ambulatory Visit | Attending: Advanced Practice Midwife | Admitting: Advanced Practice Midwife

## 2021-08-24 ENCOUNTER — Encounter: Payer: Self-pay | Admitting: General Practice

## 2021-08-24 ENCOUNTER — Ambulatory Visit (INDEPENDENT_AMBULATORY_CARE_PROVIDER_SITE_OTHER): Payer: Medicaid Other | Admitting: Advanced Practice Midwife

## 2021-08-24 ENCOUNTER — Encounter: Payer: Self-pay | Admitting: Advanced Practice Midwife

## 2021-08-24 VITALS — Wt 245.0 lb

## 2021-08-24 DIAGNOSIS — Z6841 Body Mass Index (BMI) 40.0 and over, adult: Secondary | ICD-10-CM

## 2021-08-24 DIAGNOSIS — Z3A12 12 weeks gestation of pregnancy: Secondary | ICD-10-CM | POA: Diagnosis not present

## 2021-08-24 DIAGNOSIS — Z3482 Encounter for supervision of other normal pregnancy, second trimester: Secondary | ICD-10-CM

## 2021-08-24 DIAGNOSIS — Z3480 Encounter for supervision of other normal pregnancy, unspecified trimester: Secondary | ICD-10-CM | POA: Insufficient documentation

## 2021-08-24 MED ORDER — ASPIRIN 81 MG PO CHEW: 81.0000 mg | CHEWABLE_TABLET | Freq: Every day | ORAL | 8 refills | Status: DC

## 2021-08-24 NOTE — Progress Notes (Signed)
New OB 12.[redacted] wks GA Pap 03/2019 GC/CC, OB Panel, OB Urine today Depression and anxiety screen negative Genetic screening offered and accepted

## 2021-08-25 LAB — CBC/D/PLT+RPR+RH+ABO+RUBIGG...
Antibody Screen: NEGATIVE
Basophils Absolute: 0 10*3/uL (ref 0.0–0.2)
Basos: 0 %
EOS (ABSOLUTE): 0 10*3/uL (ref 0.0–0.4)
Eos: 1 %
HCV Ab: NONREACTIVE
HIV Screen 4th Generation wRfx: NONREACTIVE
Hematocrit: 39.8 % (ref 34.0–46.6)
Hemoglobin: 13.7 g/dL (ref 11.1–15.9)
Hepatitis B Surface Ag: NEGATIVE
Immature Grans (Abs): 0 10*3/uL (ref 0.0–0.1)
Immature Granulocytes: 0 %
Lymphocytes Absolute: 2.3 10*3/uL (ref 0.7–3.1)
Lymphs: 29 %
MCH: 29.5 pg (ref 26.6–33.0)
MCHC: 34.4 g/dL (ref 31.5–35.7)
MCV: 86 fL (ref 79–97)
Monocytes Absolute: 0.4 10*3/uL (ref 0.1–0.9)
Monocytes: 5 %
Neutrophils Absolute: 5.2 10*3/uL (ref 1.4–7.0)
Neutrophils: 65 %
Platelets: 283 10*3/uL (ref 150–450)
RBC: 4.64 x10E6/uL (ref 3.77–5.28)
RDW: 13.4 % (ref 11.7–15.4)
RPR Ser Ql: NONREACTIVE
Rh Factor: POSITIVE
Rubella Antibodies, IGG: 4.44 index (ref >0.99)
WBC: 8 10*3/uL (ref 3.4–10.8)

## 2021-08-25 LAB — HEMOGLOBIN A1C
Est. average glucose Bld gHb Est-mCnc: 111 mg/dL
Hgb A1c MFr Bld: 5.5 % (ref 4.8–5.6)

## 2021-08-25 LAB — COMPREHENSIVE METABOLIC PANEL
ALT: 8 IU/L (ref 0–32)
AST: 10 IU/L (ref 0–40)
Albumin/Globulin Ratio: 1.5 (ref 1.2–2.2)
Albumin: 4.1 g/dL (ref 3.9–5.0)
Alkaline Phosphatase: 73 IU/L (ref 44–121)
BUN/Creatinine Ratio: 7 — ABNORMAL LOW (ref 9–23)
BUN: 5 mg/dL — ABNORMAL LOW (ref 6–20)
Bilirubin Total: 0.2 mg/dL (ref 0.0–1.2)
CO2: 20 mmol/L (ref 20–29)
Calcium: 9.2 mg/dL (ref 8.7–10.2)
Chloride: 102 mmol/L (ref 96–106)
Creatinine, Ser: 0.71 mg/dL (ref 0.57–1.00)
Globulin, Total: 2.8 g/dL (ref 1.5–4.5)
Glucose: 96 mg/dL (ref 70–99)
Potassium: 3.8 mmol/L (ref 3.5–5.2)
Sodium: 135 mmol/L (ref 134–144)
Total Protein: 6.9 g/dL (ref 6.0–8.5)
eGFR: 119 mL/min/{1.73_m2} (ref >59)

## 2021-08-25 LAB — HCV INTERPRETATION

## 2021-08-26 LAB — CYTOLOGY - PAP
Chlamydia: NEGATIVE
Comment: NEGATIVE
Comment: NORMAL
Diagnosis: NEGATIVE
Neisseria Gonorrhea: NEGATIVE

## 2021-08-26 LAB — CULTURE, OB URINE

## 2021-08-26 LAB — URINE CULTURE, OB REFLEX

## 2021-09-02 LAB — PANORAMA PRENATAL TEST FULL PANEL:PANORAMA TEST PLUS 5 ADDITIONAL MICRODELETIONS: FETAL FRACTION: 4.7

## 2021-09-23 ENCOUNTER — Ambulatory Visit (INDEPENDENT_AMBULATORY_CARE_PROVIDER_SITE_OTHER): Payer: Medicaid Other | Admitting: Family Medicine

## 2021-09-23 VITALS — BP 123/70 | HR 106 | Wt 249.0 lb

## 2021-09-23 DIAGNOSIS — Z3480 Encounter for supervision of other normal pregnancy, unspecified trimester: Secondary | ICD-10-CM

## 2021-09-23 DIAGNOSIS — Z3A16 16 weeks gestation of pregnancy: Secondary | ICD-10-CM

## 2021-09-23 NOTE — Progress Notes (Signed)
   PRENATAL VISIT NOTE  Subjective:  Kristen [name] is a 31 y.o. G3P2002 at [redacted]w[redacted]d being seen today for ongoing prenatal care.  She is currently monitored for the following issues for this low-risk pregnancy and has Sickle cell trait (HCC) and Supervision of other normal pregnancy, antepartum on their problem list.  Patient reports no complaints.  Contractions: Not present. Vag. Bleeding: None.   . Denies leaking of fluid.   The following portions of the patient's history were reviewed and updated as appropriate: allergies, current medications, past family history, past medical history, past social history, past surgical history and problem list.   Objective:   Vitals:   09/23/21 1126  BP: 123/70  Pulse: (!) 106  Weight: 249 lb (112.9 kg)    Fetal Status: Fetal Heart Rate (bpm): 152         General:  Alert, oriented and cooperative. Patient is in no acute distress.  Skin: Skin is warm and dry. No rash noted.   Cardiovascular: Normal heart rate noted  Respiratory: Normal respiratory effort, no problems with respiration noted  Abdomen: Soft, gravid, appropriate for gestational age.  Pain/Pressure: Present     Pelvic: Cervical exam deferred        Extremities: Normal range of motion.  Edema: None  Mental Status: Normal mood and affect. Normal behavior. Normal judgment and thought content.   Assessment and Plan:  Pregnancy: G3P2002 at [redacted]w[redacted]d 1. [redacted] weeks gestation of pregnancy - AFP, Serum, Open Spina Bifida  2. Supervision of other normal pregnancy, antepartum Continue routine prenatal care. Has anatomy u/s scheduled  General obstetric precautions including but not limited to vaginal bleeding, contractions, leaking of fluid and fetal movement were reviewed in detail with the patient. Please refer to After Visit Summary for other counseling recommendations.   Return in 4 weeks (on 10/21/2021).  Future Appointments  Date Time Provider Department Center  10/12/2021 12:45 PM [facility]  NURSE [facility]  10/12/2021  1:00 PM [facility] US1 [facility]  10/19/2021  9:55 AM Aviva Signs, CNM [facility] None  11/16/2021  9:15 AM Aviva Signs, CNM [facility] None  12/14/2021  8:35 AM Gerrit Heck, CNM [facility] None  12/28/2021 10:55 AM Aviva Signs, CNM [facility] None    Reva Bores, MD

## 2021-09-25 LAB — AFP, SERUM, OPEN SPINA BIFIDA
AFP MoM: 1.16
AFP Value: 32.6 ng/mL
Gest. Age on Collection Date: 16 weeks
Maternal Age At EDD: 29.4 yr
OSBR Risk 1 IN: 10000
Test Results:: NEGATIVE
Weight: 249 [lb_av]

## 2021-10-04 ENCOUNTER — Encounter: Payer: Self-pay | Admitting: Advanced Practice Midwife

## 2021-10-12 ENCOUNTER — Ambulatory Visit: Payer: Medicaid Other | Admitting: *Deleted

## 2021-10-12 ENCOUNTER — Other Ambulatory Visit: Payer: Self-pay | Admitting: *Deleted

## 2021-10-12 ENCOUNTER — Ambulatory Visit: Payer: Medicaid Other | Attending: Advanced Practice Midwife

## 2021-10-12 VITALS — BP 119/62 | HR 99

## 2021-10-12 DIAGNOSIS — Z3480 Encounter for supervision of other normal pregnancy, unspecified trimester: Secondary | ICD-10-CM

## 2021-10-12 DIAGNOSIS — Z3A12 12 weeks gestation of pregnancy: Secondary | ICD-10-CM

## 2021-10-12 DIAGNOSIS — D573 Sickle-cell trait: Secondary | ICD-10-CM | POA: Diagnosis not present

## 2021-10-12 DIAGNOSIS — Z3A19 19 weeks gestation of pregnancy: Secondary | ICD-10-CM | POA: Insufficient documentation

## 2021-10-12 DIAGNOSIS — Z363 Encounter for antenatal screening for malformations: Secondary | ICD-10-CM | POA: Diagnosis not present

## 2021-10-12 DIAGNOSIS — O99012 Anemia complicating pregnancy, second trimester: Secondary | ICD-10-CM | POA: Diagnosis not present

## 2021-10-12 DIAGNOSIS — O99212 Obesity complicating pregnancy, second trimester: Secondary | ICD-10-CM

## 2021-10-12 DIAGNOSIS — Z6841 Body Mass Index (BMI) 40.0 and over, adult: Secondary | ICD-10-CM | POA: Insufficient documentation

## 2021-10-19 ENCOUNTER — Encounter: Payer: Self-pay | Admitting: Advanced Practice Midwife

## 2021-10-19 ENCOUNTER — Ambulatory Visit (INDEPENDENT_AMBULATORY_CARE_PROVIDER_SITE_OTHER): Payer: Medicaid Other | Admitting: Advanced Practice Midwife

## 2021-10-19 VITALS — BP 128/73 | HR 98 | Wt 252.0 lb

## 2021-10-19 DIAGNOSIS — D573 Sickle-cell trait: Secondary | ICD-10-CM

## 2021-10-19 DIAGNOSIS — Z3A20 20 weeks gestation of pregnancy: Secondary | ICD-10-CM

## 2021-10-19 DIAGNOSIS — O99012 Anemia complicating pregnancy, second trimester: Secondary | ICD-10-CM

## 2021-10-19 DIAGNOSIS — Z3480 Encounter for supervision of other normal pregnancy, unspecified trimester: Secondary | ICD-10-CM

## 2021-10-19 DIAGNOSIS — R42 Dizziness and giddiness: Secondary | ICD-10-CM

## 2021-10-19 NOTE — Progress Notes (Signed)
   PRENATAL VISIT NOTE  Subjective:  Kristen [name] is a 31 y.o. G3P2002 at [redacted]w[redacted]d being seen today for ongoing prenatal care.  She is currently monitored for the following issues for this high-risk pregnancy and has Sickle cell trait (HCC) and Supervision of other normal pregnancy, antepartum on their problem list.  Has a history of Gestational Hypertension in past pregnancy  Patient reports  occasional lightheadedness, relieved by sitting down .  Contractions: Not present. Vag. Bleeding: None.  Movement: Present. Denies leaking of fluid.   The following portions of the patient's history were reviewed and updated as appropriate: allergies, current medications, past family history, past medical history, past social history, past surgical history and problem list.   Objective:   Vitals:   10/19/21 1011  BP: 128/73  Pulse: 98  Weight: 252 lb (114.3 kg)    Fetal Status: Fetal Heart Rate (bpm): 145   Movement: Present     General:  Alert, oriented and cooperative. Patient is in no acute distress.  Skin: Skin is warm and dry. No rash noted.   Cardiovascular: Normal heart rate noted  Respiratory: Normal respiratory effort, no problems with respiration noted  Abdomen: Soft, gravid, appropriate for gestational age.  Pain/Pressure: Absent     Pelvic: Cervical exam deferred        Extremities: Normal range of motion.  Edema: None  Mental Status: Normal mood and affect. Normal behavior. Normal judgment and thought content.   Assessment and Plan:  Pregnancy: G3P2002 at [redacted]w[redacted]d 1. Supervision of other normal pregnancy, antepartum      Anatomy US normal, growth [place] scheduled due to BMI  2. Sickle cell trait (HCC)   3. [redacted] weeks gestation of pregnancy   4. Episodic lightheadedness     Discussed hydration, will likely resolve over next few weeks  Preterm labor symptoms and general obstetric precautions including but not limited to vaginal bleeding, contractions, leaking of fluid and fetal  movement were reviewed in detail with the patient. Please refer to After Visit Summary for other counseling recommendations.   Return in about 4 weeks (around 11/16/2021) for [facility].  Future Appointments  Date Time Provider Department Center  11/16/2021  9:15 AM Aviva Signs, CNM [facility] None  12/07/2021 12:30 PM [facility] NURSE [facility]  12/07/2021 12:45 PM [facility] US4 [facility]  12/14/2021  8:35 AM Gerrit Heck, CNM [facility] None  12/28/2021 10:55 AM Aviva Signs, CNM [facility] None    Wynelle Bourgeois, CNM

## 2021-11-16 ENCOUNTER — Encounter: Payer: Self-pay | Admitting: Advanced Practice Midwife

## 2021-11-16 ENCOUNTER — Ambulatory Visit (INDEPENDENT_AMBULATORY_CARE_PROVIDER_SITE_OTHER): Payer: Medicaid Other | Admitting: Advanced Practice Midwife

## 2021-11-16 VITALS — BP 113/66 | HR 119 | Wt 252.0 lb

## 2021-11-16 DIAGNOSIS — O99012 Anemia complicating pregnancy, second trimester: Secondary | ICD-10-CM

## 2021-11-16 DIAGNOSIS — D573 Sickle-cell trait: Secondary | ICD-10-CM

## 2021-11-16 DIAGNOSIS — Z3A24 24 weeks gestation of pregnancy: Secondary | ICD-10-CM

## 2021-11-16 NOTE — Progress Notes (Signed)
   PRENATAL VISIT NOTE  Subjective:  Kristen [name] is a 31 y.o. G3P2002 at [redacted]w[redacted]d being seen today for ongoing prenatal care.  She is currently monitored for the following issues for this low-risk pregnancy and has Sickle cell trait ([place]) and Supervision of other normal pregnancy, antepartum on their problem list.  Patient reports  some sleeping issues, taking naps daily .  Contractions: Not present. Vag. Bleeding: None.  Movement: Present. Denies leaking of fluid.   The following portions of the patient's history were reviewed and updated as appropriate: allergies, current medications, past family history, past medical history, past social history, past surgical history and problem list.   Objective:   Vitals:   11/16/21 0935  BP: 113/66  Pulse: (!) 119  Weight: 252 lb (114.3 kg)    Fetal Status: Fetal Heart Rate (bpm): 160   Movement: Present     General:  Alert, oriented and cooperative. Patient is in no acute distress.  Skin: Skin is warm and dry. No rash noted.   Cardiovascular: Normal heart rate noted  Respiratory: Normal respiratory effort, no problems with respiration noted  Abdomen: Soft, gravid, appropriate for gestational age.  Pain/Pressure: Absent     Pelvic: Cervical exam deferred        Extremities: Normal range of motion.  Edema: None  Mental Status: Normal mood and affect. Normal behavior. Normal judgment and thought content.   Assessment and Plan:  Pregnancy: G3P2002 at [redacted]w[redacted]d 1. Sickle cell trait (HCC)      Hgb 13.7  2. [redacted] weeks gestation of pregnancy      Glucola next visit  Preterm labor symptoms and general obstetric precautions including but not limited to vaginal bleeding, contractions, leaking of fluid and fetal movement were reviewed in detail with the patient. Please refer to After Visit Summary for other counseling recommendations.   Return in about 4 weeks (around 12/14/2021) for [employer].  Future Appointments  Date Time Provider  [place]  12/07/2021 12:30 PM [facility] NURSE [facility]  12/07/2021 12:45 PM [facility] US4 [facility]  12/14/2021  8:35 AM Laury Deep, CNM [facility] None  12/28/2021 10:55 AM Seabron Spates, CNM [facility] None    Hansel Feinstein, CNM

## 2021-12-07 ENCOUNTER — Ambulatory Visit: Payer: Medicaid Other | Attending: Obstetrics

## 2021-12-07 ENCOUNTER — Ambulatory Visit: Payer: Medicaid Other | Admitting: *Deleted

## 2021-12-07 VITALS — BP 120/72 | HR 102

## 2021-12-07 DIAGNOSIS — Z3480 Encounter for supervision of other normal pregnancy, unspecified trimester: Secondary | ICD-10-CM | POA: Diagnosis present

## 2021-12-07 DIAGNOSIS — E669 Obesity, unspecified: Secondary | ICD-10-CM | POA: Diagnosis not present

## 2021-12-07 DIAGNOSIS — Z3A27 27 weeks gestation of pregnancy: Secondary | ICD-10-CM | POA: Diagnosis not present

## 2021-12-07 DIAGNOSIS — D573 Sickle-cell trait: Secondary | ICD-10-CM | POA: Diagnosis not present

## 2021-12-07 DIAGNOSIS — O99212 Obesity complicating pregnancy, second trimester: Secondary | ICD-10-CM | POA: Diagnosis not present

## 2021-12-07 DIAGNOSIS — O99012 Anemia complicating pregnancy, second trimester: Secondary | ICD-10-CM | POA: Diagnosis not present

## 2021-12-07 DIAGNOSIS — O285 Abnormal chromosomal and genetic finding on antenatal screening of mother: Secondary | ICD-10-CM

## 2021-12-14 ENCOUNTER — Encounter: Payer: Self-pay | Admitting: General Practice

## 2021-12-14 ENCOUNTER — Ambulatory Visit (INDEPENDENT_AMBULATORY_CARE_PROVIDER_SITE_OTHER): Payer: Medicaid Other | Admitting: Obstetrics and Gynecology

## 2021-12-14 VITALS — BP 134/74 | HR 94 | Wt 252.0 lb

## 2021-12-14 DIAGNOSIS — Z3A28 28 weeks gestation of pregnancy: Secondary | ICD-10-CM

## 2021-12-14 DIAGNOSIS — D573 Sickle-cell trait: Secondary | ICD-10-CM

## 2021-12-14 DIAGNOSIS — O99213 Obesity complicating pregnancy, third trimester: Secondary | ICD-10-CM

## 2021-12-14 DIAGNOSIS — Z23 Encounter for immunization: Secondary | ICD-10-CM

## 2021-12-14 DIAGNOSIS — Z3480 Encounter for supervision of other normal pregnancy, unspecified trimester: Secondary | ICD-10-CM

## 2021-12-14 DIAGNOSIS — Z3483 Encounter for supervision of other normal pregnancy, third trimester: Secondary | ICD-10-CM

## 2021-12-14 DIAGNOSIS — O99210 Obesity complicating pregnancy, unspecified trimester: Secondary | ICD-10-CM | POA: Insufficient documentation

## 2021-12-14 DIAGNOSIS — O24410 Gestational diabetes mellitus in pregnancy, diet controlled: Secondary | ICD-10-CM

## 2021-12-14 NOTE — Patient Instructions (Signed)
Iron-Rich Diet  Iron is a mineral that helps your body produce hemoglobin. Hemoglobin is a protein in red blood cells that carries oxygen to your body's tissues. Eating too little iron may cause you to feel weak and tired, and it can increase your risk of infection. Iron is naturally found in many foods, and many foods have iron added to them (are iron-fortified). You may need to follow an iron-rich diet if you do not have enough iron in your body due to certain medical conditions. The amount of iron that you need each day depends on your age, your sex, and any medical conditions you have. Follow instructions from your health care provider or a dietitian about how much iron you should eat each day. What are tips for following this plan? Reading food labels Check food labels to see how many milligrams (mg) of iron are in each serving. Cooking Cook foods in pots and pans that are made from iron. Take these steps to make it easier for your body to absorb iron from certain foods: Soak beans overnight before cooking. Soak whole grains overnight and drain them before using. Ferment flours before baking, such as by using yeast in bread dough. Meal planning When you eat foods that contain iron, you should eat them with foods that are high in vitamin C. These include oranges, peppers, tomatoes, potatoes, and mangoes. Vitamin C helps your body absorb iron. Certain foods and drinks prevent your body from absorbing iron properly. Avoid eating these foods in the same meal as iron-rich foods or with iron supplements. These foods include: Coffee, black tea, and red wine. Milk, dairy products, and foods that are high in calcium. Beans and soybeans. Whole grains. General information Take iron supplements only as told by your health care provider. An overdose of iron can be life-threatening. If you were prescribed iron supplements, take them with orange juice or a vitamin C supplement. When you eat  iron-fortified foods or take an iron supplement, you should also eat foods that naturally contain iron, such as meat, poultry, and fish. Eating naturally iron-rich foods helps your body absorb the iron that is added to other foods or contained in a supplement. Iron from animal sources is better absorbed than iron from plant sources. What foods should I eat? Fruits Prunes. Raisins. Eat fruits high in vitamin C, such as oranges, grapefruits, and strawberries, with iron-rich foods. Vegetables Spinach (cooked). Green peas. Broccoli. Fermented vegetables. Eat vegetables high in vitamin C, such as leafy greens, potatoes, bell peppers, and tomatoes, with iron-rich foods. Grains Iron-fortified breakfast cereal. Iron-fortified whole-wheat bread. Enriched rice. Sprouted grains. Meats and other proteins Beef liver. Beef. [place]. Chicken. Oysters. Shrimp. [place]. Sardines. Chickpeas. Nuts. Tofu. Pumpkin seeds. Beverages Tomato juice. Fresh orange juice. Prune juice. Hibiscus tea. Iron-fortified instant breakfast shakes. Sweets and desserts Blackstrap molasses. Seasonings and condiments Tahini. Fermented soy sauce. Other foods Wheat germ. The items listed above may not be a complete list of recommended foods and beverages. Contact a dietitian for more information. What foods should I limit? These are foods that should be limited while eating iron-rich foods as they can reduce the absorption of iron in your body. Grains Whole grains. Bran cereal. Bran flour. Meats and other proteins Soybeans. Products made from soy protein. Black beans. Lentils. Mung beans. Split peas. Dairy Milk. Cream. Cheese. Yogurt. Cottage cheese. Beverages Coffee. Black tea. Red wine. Sweets and desserts Cocoa. Chocolate. Ice cream. Seasonings and condiments Basil. Oregano. Large amounts of parsley. The items listed  above may not be a complete list of foods and beverages you should limit. Contact a dietitian for more  information. Summary Iron is a mineral that helps your body produce hemoglobin. Hemoglobin is a protein in red blood cells that carries oxygen to your body's tissues. Iron is naturally found in many foods, and many foods have iron added to them (are iron-fortified). When you eat foods that contain iron, you should eat them with foods that are high in vitamin C. Vitamin C helps your body absorb iron. Certain foods and drinks prevent your body from absorbing iron properly, such as whole grains and dairy products. You should avoid eating these foods in the same meal as iron-rich foods or with iron supplements. This information is not intended to replace advice given to you by your health care provider. Make sure you discuss any questions you have with your health care provider. Document Revised: 01/27/2020 Document Reviewed: 01/27/2020 Elsevier Patient Education  [place]. Fetal Movement Counts Patient Name: ________________________________________________ Patient Due Date: ____________________ What is a fetal movement count? A fetal movement count is the number of times that you feel your baby move during a certain amount of time. This may also be called a fetal kick count. A fetal movement count is recommended for every pregnant woman. You may be asked to start counting fetal movements as early as week 28 of your pregnancy. Pay attention to when your baby is most active. You may notice your baby's sleep and wake cycles. You may also notice things that make your baby move more. You should do a fetal movement count: When your baby is normally most active. At the same time each day. A good time to count movements is while you are resting, after having something to eat and drink. How do I count fetal movements? Find a quiet, comfortable area. Sit, or lie down on your side. Write down the date, the start time and stop time, and the number of movements that you felt between those two times.  Take this information with you to your health care visits. Write down your start time when you feel the first movement. Count kicks, flutters, swishes, rolls, and jabs. You should feel at least 10 movements. You may stop counting after you have felt 10 movements, or if you have been counting for 2 hours. Write down the stop time. If you do not feel 10 movements in 2 hours, contact your health care provider for further instructions. Your health care provider may want to do additional tests to assess your baby's well-being. Contact a health care provider if: You feel fewer than 10 movements in 2 hours. Your baby is not moving like he or she usually does. Date: ____________ Start time: ____________ Stop time: ____________ Movements: ____________ Date: ____________ Start time: ____________ Stop time: ____________ Movements: ____________ Date: ____________ Start time: ____________ Stop time: ____________ Movements: ____________ Date: ____________ Start time: ____________ Stop time: ____________ Movements: ____________ Date: ____________ Start time: ____________ Stop time: ____________ Movements: ____________ Date: ____________ Start time: ____________ Stop time: ____________ Movements: ____________ Date: ____________ Start time: ____________ Stop time: ____________ Movements: ____________ Date: ____________ Start time: ____________ Stop time: ____________ Movements: ____________ Date: ____________ Start time: ____________ Stop time: ____________ Movements: ____________ This information is not intended to replace advice given to you by your health care provider. Make sure you discuss any questions you have with your health care provider. Document Revised: 10/04/2018 Document Reviewed: 10/04/2018 Elsevier Patient Education  [place].

## 2021-12-14 NOTE — Progress Notes (Addendum)
   LOW-RISK PREGNANCY OFFICE VISIT Patient name: Kristen [name] MRN [id number]  Date of birth: 1990-11-25 Chief Complaint:   No chief complaint on file.  History of Present Illness:   Kristen [name] is a 31 y.o. G12P2002 female at [redacted]w[redacted]d with an Estimated Date of Delivery: 03/04/22 being seen today for ongoing management of a low-risk pregnancy.  Today she reports no complaints. Contractions: Not present. Vag. Bleeding: None.  Movement: Present. denies leaking of fluid. Review of Systems:   Pertinent items are noted in HPI Denies abnormal vaginal discharge w/ itching/odor/irritation, headaches, visual changes, shortness of breath, chest pain, abdominal pain, severe nausea/vomiting, or problems with urination or bowel movements unless otherwise stated above. Pertinent History Reviewed:  Reviewed past medical,surgical, social, obstetrical and family history.  Reviewed problem list, medications and allergies. Physical Assessment:   Vitals:   12/14/21 0916  BP: 134/74  Pulse: 94  Weight: 252 lb (114.3 kg)  Body mass index is 41.93 kg/m.        Physical Examination:   General appearance: Well appearing, and in no distress  Mental status: Alert, oriented to person, place, and time  Skin: Warm & dry  Cardiovascular: Normal heart rate noted  Respiratory: Normal respiratory effort, no distress  Abdomen: Soft, gravid, nontender  Pelvic: Cervical exam deferred         Extremities: Edema: None  Fetal Status: Fetal Heart Rate (bpm): 150 Fundal Height: 27 cm Movement: Present    No results found for this or any previous visit (from the past 24 hour(s)).  Assessment & Plan:  1) Low-risk pregnancy G3P2002 at [redacted]w[redacted]d with an Estimated Date of Delivery: 03/04/22   2) Supervision of other normal pregnancy, antepartum  - Glucose Tolerance, 2 Hours w/1 Hour,  - RPR,  - HIV antibody (with reflex),  - CBC - Discussed different BC methods >>PoPs (patient got pregnant this time using) and LARCs - Patient  expressed desire for Depo injections since she has had them in the past - Information provided on [place] and iron rich diet   3) Obesity affecting pregnancy, antepartum, unspecified obesity type  - Taking daily bASA - Last EFW  at 27.[redacted] wks gestation = 2 lbs 7 oz (1112 gm  42%) - MFM did not recommend any F/U   4) Sickle Cell Trait - CBC today - No complications  [redacted] weeks gestation of pregnancy   Meds: No orders of the defined types were placed in this encounter.  Labs/procedures today: 2 hr GTT, 3rd trimester labs and TDaP vaccine  Plan:  Continue routine obstetrical care   Reviewed: Preterm labor symptoms and general obstetric precautions including but not limited to vaginal bleeding, contractions, leaking of fluid and fetal movement were reviewed in detail with the patient.  All questions were answered. Has home bp cuff. Check bp weekly, let us know if >140/90.   Follow-up: Return in about 2 weeks (around 12/28/2021) for Return OB visit.  Orders Placed This Encounter  Procedures   Tdap vaccine greater than or equal to 7yo IM   Glucose Tolerance, 2 Hours w/1 Hour   RPR   HIV antibody (with reflex)   CBC   Laury Deep MSN, CNM 12/14/2021 10:06 AM

## 2021-12-15 ENCOUNTER — Encounter: Payer: Self-pay | Admitting: Obstetrics and Gynecology

## 2021-12-15 DIAGNOSIS — O24419 Gestational diabetes mellitus in pregnancy, unspecified control: Secondary | ICD-10-CM | POA: Insufficient documentation

## 2021-12-15 DIAGNOSIS — O24410 Gestational diabetes mellitus in pregnancy, diet controlled: Secondary | ICD-10-CM | POA: Insufficient documentation

## 2021-12-15 HISTORY — DX: Gestational diabetes mellitus in pregnancy, unspecified control: O24.419

## 2021-12-15 LAB — GLUCOSE TOLERANCE, 2 HOURS W/ 1HR
Glucose, 1 hour: 152 mg/dL (ref 70–179)
Glucose, 2 hour: 142 mg/dL (ref 70–152)
Glucose, Fasting: 92 mg/dL — ABNORMAL HIGH (ref 70–91)

## 2021-12-15 LAB — CBC
Hematocrit: 33.2 % — ABNORMAL LOW (ref 34.0–46.6)
Hemoglobin: 11.3 g/dL (ref 11.1–15.9)
MCH: 28.4 pg (ref 26.6–33.0)
MCHC: 34 g/dL (ref 31.5–35.7)
MCV: 83 fL (ref 79–97)
Platelets: 280 10*3/uL (ref 150–450)
RBC: 3.98 x10E6/uL (ref 3.77–5.28)
RDW: 13.5 % (ref 11.7–15.4)
WBC: 8.2 10*3/uL (ref 3.4–10.8)

## 2021-12-15 LAB — RPR: RPR Ser Ql: NONREACTIVE

## 2021-12-15 LAB — HIV ANTIBODY (ROUTINE TESTING W REFLEX): HIV Screen 4th Generation wRfx: NONREACTIVE

## 2021-12-15 MED ORDER — ACCU-CHEK SOFTCLIX LANCETS MISC: 12 refills | Status: DC

## 2021-12-15 MED ORDER — ACCU-CHEK GUIDE W/DEVICE KIT: 1.0000 | PACK | Freq: Four times a day (QID) | 0 refills | Status: DC

## 2021-12-15 NOTE — Addendum Note (Signed)
Addended by: Graceann Congress on: 12/15/2021 11:52 AM   Modules accepted: Orders

## 2021-12-15 NOTE — Addendum Note (Signed)
Addended by: Graceann Congress on: 12/15/2021 11:07 AM   Modules accepted: Orders

## 2021-12-22 ENCOUNTER — Encounter: Payer: Self-pay | Admitting: Registered"

## 2021-12-22 ENCOUNTER — Encounter: Payer: Medicaid Other | Attending: Obstetrics and Gynecology | Admitting: Registered"

## 2021-12-22 DIAGNOSIS — Z713 Dietary counseling and surveillance: Secondary | ICD-10-CM | POA: Diagnosis not present

## 2021-12-22 DIAGNOSIS — O24410 Gestational diabetes mellitus in pregnancy, diet controlled: Secondary | ICD-10-CM | POA: Diagnosis present

## 2021-12-22 NOTE — Progress Notes (Signed)
Patient was seen on 12/22/21 for Gestational Diabetes self-management class at [facility]. The following learning objectives were met by the patient during this course:  States the definition of Gestational Diabetes States why dietary management is important in controlling blood glucose Describes the effects each nutrient has on blood glucose levels Demonstrates ability to create a balanced meal plan Demonstrates carbohydrate counting  States when to check blood glucose levels Demonstrates proper blood glucose monitoring techniques States the effect of stress and exercise on blood glucose levels States the importance of limiting caffeine and abstaining from alcohol and smoking  Blood glucose monitor given: None  Patient has meter and checking blood sugar prior to class.  Patient instructed to monitor glucose levels: FBS: 60 - <95; 1 hour: <140; 2 hour: <120  Patient received handouts: Nutrition Diabetes and Pregnancy, including carb counting list  Patient will be seen for follow-up as needed.

## 2021-12-28 ENCOUNTER — Encounter: Payer: Medicaid Other | Admitting: Advanced Practice Midwife

## 2021-12-28 ENCOUNTER — Encounter: Payer: Self-pay | Admitting: Advanced Practice Midwife

## 2021-12-28 ENCOUNTER — Ambulatory Visit (INDEPENDENT_AMBULATORY_CARE_PROVIDER_SITE_OTHER): Payer: Medicaid Other | Admitting: Advanced Practice Midwife

## 2021-12-28 VITALS — BP 134/72 | HR 94 | Wt 247.0 lb

## 2021-12-28 DIAGNOSIS — Z3A30 30 weeks gestation of pregnancy: Secondary | ICD-10-CM

## 2021-12-28 DIAGNOSIS — O24410 Gestational diabetes mellitus in pregnancy, diet controlled: Secondary | ICD-10-CM

## 2021-12-28 DIAGNOSIS — Z3480 Encounter for supervision of other normal pregnancy, unspecified trimester: Secondary | ICD-10-CM

## 2021-12-28 DIAGNOSIS — K219 Gastro-esophageal reflux disease without esophagitis: Secondary | ICD-10-CM | POA: Insufficient documentation

## 2021-12-28 MED ORDER — PANTOPRAZOLE SODIUM 20 MG PO TBEC: 20.0000 mg | DELAYED_RELEASE_TABLET | Freq: Every day | ORAL | 2 refills | Status: DC

## 2021-12-28 NOTE — Addendum Note (Signed)
Addended by: Phill Myron on: 12/28/2021 01:44 PM   Modules accepted: Orders

## 2021-12-28 NOTE — Progress Notes (Addendum)
   PRENATAL VISIT NOTE  Subjective:  Kristen [name] is a 31 y.o. G3P2002 at [redacted]w[redacted]d being seen today for ongoing prenatal care.  She is currently monitored for the following issues for this high-risk pregnancy and has Sickle cell trait ([place]); Supervision of other normal pregnancy, antepartum; Obesity affecting pregnancy, antepartum; and Gestational diabetes mellitus, class A1 on their problem list.  Diagnosed with GDM (only had FBS of 92, others normal ) States Pepcid isn't helping her reflux  Patient reports no complaints.  Contractions: Not present. Vag. Bleeding: None.  Movement: Present. Denies leaking of fluid.   The following portions of the patient's history were reviewed and updated as appropriate: allergies, current medications, past family history, past medical history, past social history, past surgical history and problem list.   Objective:   Vitals:   12/28/21 0849  BP: 134/72  Pulse: 94  Weight: 247 lb (112 kg)    Fetal Status: Fetal Heart Rate (bpm): 150   Movement: Present     General:  Alert, oriented and cooperative. Patient is in no acute distress.  Skin: Skin is warm and dry. No rash noted.   Cardiovascular: Normal heart rate noted  Respiratory: Normal respiratory effort, no problems with respiration noted  Abdomen: Soft, gravid, appropriate for gestational age.  Pain/Pressure: Present     Pelvic: Cervical exam deferred        Extremities: Normal range of motion.  Edema: None  Mental Status: Normal mood and affect. Normal behavior. Normal judgment and thought content.   Assessment and Plan:  Pregnancy: G3P2002 at [redacted]w[redacted]d 1. Supervision of other normal pregnancy, antepartum Doing well   2. Gestational diabetes mellitus, class A1 States most FBS under 90, and most PP are under 120 Will schedule Growth [place] at 32 and 36wks  3. [redacted] weeks gestation of pregnancy      D/C pepcid      Rx sent for Protonix 20mg  qd, may need to increase to 40mg  prn  Preterm labor  symptoms and general obstetric precautions including but not limited to vaginal bleeding, contractions, leaking of fluid and fetal movement were reviewed in detail with the patient. Please refer to After Visit Summary for other counseling recommendations.   Return in about 2 weeks (around 01/11/2022) for [employer].  Future Appointments  Date Time Provider [place]  01/11/2022  8:15 AM Seabron Spates, CNM [facility] None  01/25/2022  8:15 AM Seabron Spates, CNM [facility] None  02/08/2022  8:35 AM Gavin Pound, CNM [facility] None  02/15/2022  8:15 AM Seabron Spates, CNM [facility] None  02/23/2022  8:55 AM Nehemiah Settle Tanna Savoy, DO [facility] None    Hansel Feinstein, CNM

## 2022-01-05 ENCOUNTER — Other Ambulatory Visit: Payer: Self-pay | Admitting: Emergency Medicine

## 2022-01-05 ENCOUNTER — Encounter: Payer: Self-pay | Admitting: Obstetrics and Gynecology

## 2022-01-05 MED ORDER — ACCU-CHEK GUIDE VI STRP: ORAL_STRIP | 12 refills | Status: DC

## 2022-01-10 ENCOUNTER — Ambulatory Visit: Payer: Medicaid Other | Attending: Advanced Practice Midwife

## 2022-01-10 ENCOUNTER — Ambulatory Visit: Payer: Medicaid Other | Admitting: *Deleted

## 2022-01-10 ENCOUNTER — Ambulatory Visit: Payer: Medicaid Other | Attending: Obstetrics and Gynecology | Admitting: Obstetrics and Gynecology

## 2022-01-10 ENCOUNTER — Other Ambulatory Visit: Payer: Self-pay | Admitting: *Deleted

## 2022-01-10 VITALS — BP 118/67 | HR 101

## 2022-01-10 DIAGNOSIS — O24410 Gestational diabetes mellitus in pregnancy, diet controlled: Secondary | ICD-10-CM | POA: Diagnosis not present

## 2022-01-10 DIAGNOSIS — E669 Obesity, unspecified: Secondary | ICD-10-CM | POA: Diagnosis not present

## 2022-01-10 DIAGNOSIS — O99019 Anemia complicating pregnancy, unspecified trimester: Secondary | ICD-10-CM

## 2022-01-10 DIAGNOSIS — O99213 Obesity complicating pregnancy, third trimester: Secondary | ICD-10-CM

## 2022-01-10 DIAGNOSIS — Z3A32 32 weeks gestation of pregnancy: Secondary | ICD-10-CM | POA: Diagnosis not present

## 2022-01-10 DIAGNOSIS — E668 Other obesity: Secondary | ICD-10-CM

## 2022-01-10 DIAGNOSIS — O3663X0 Maternal care for excessive fetal growth, third trimester, not applicable or unspecified: Secondary | ICD-10-CM | POA: Insufficient documentation

## 2022-01-10 DIAGNOSIS — Z3480 Encounter for supervision of other normal pregnancy, unspecified trimester: Secondary | ICD-10-CM | POA: Insufficient documentation

## 2022-01-10 DIAGNOSIS — O24419 Gestational diabetes mellitus in pregnancy, unspecified control: Secondary | ICD-10-CM | POA: Insufficient documentation

## 2022-01-10 DIAGNOSIS — O285 Abnormal chromosomal and genetic finding on antenatal screening of mother: Secondary | ICD-10-CM

## 2022-01-10 DIAGNOSIS — O99013 Anemia complicating pregnancy, third trimester: Secondary | ICD-10-CM | POA: Diagnosis not present

## 2022-01-10 DIAGNOSIS — D573 Sickle-cell trait: Secondary | ICD-10-CM

## 2022-01-10 NOTE — Progress Notes (Signed)
Maternal-Fetal Medicine   Name: Kristen [name] DOB: 26-Jan-1991 MRN: [id number] Referring Provider: Wynelle Bourgeois, CNM  I had the pleasure of seeing [name] at [facility]. She is G3 P2002 at 32w 3d gestation and is here for fetal growth assessment. She has a new diagnosis of gestational diabetes. Patient reports that she is checking her blood glucose regularly and they are within normal range.  Obstetric history significant for 2 term vaginal deliveries. Patient reports no chronic medical conditions.  Blood pressure today at our office is 118/67 mmHg.  Ultrasound Fetal growth is appropriate for gestational age.  Amniotic fluid is normal and good fetal activity seen.  Cephalic presentation.  Gestational diabetes I explained the diagnosis of gestational diabetes.  I emphasized the importance of good blood glucose control to prevent adverse fetal or neonatal outcomes.  I discussed normal range of blood glucose values. Patient is very motivated and is aware of the importance of checking her blood glucose regularly.   Possible complications of gestational diabetes include fetal macrosomia, shoulder dystocia and birth injuries, stillbirth (in poorly controlled diabetes) and neonatal respiratory syndrome and other complications.  In about 85% of cases, gestational diabetes is well controlled by diet alone.  Exercise reduces the need for insulin.  Medical treatment includes oral hypoglycemics or insulin.  Timing of delivery: In well-controlled diabetes on diet, patient can be delivered at 20- or 40-weeks' gestation. Vaginal delivery is not contraindicated. Type 2 diabetes develops in up to 50% of women with GDM. I recommend postpartum screening with 75-g glucose load at 6 to 12 weeks after delivery.  Recommendations -An appointment was made for her to return in 4 weeks for fetal growth assessment. -Delivery at 39 to [redacted] weeks gestation provided diabetes is well  controlled on diet. -If patient requires metformin or insulin, I recommend weekly BPP.   Thank you for consultation.  If you have any questions or concerns, please contact me the [facility].  Consultation including face-to-face (more than 50%) counseling 30 minutes.

## 2022-01-11 ENCOUNTER — Ambulatory Visit (INDEPENDENT_AMBULATORY_CARE_PROVIDER_SITE_OTHER): Payer: Medicaid Other | Admitting: Advanced Practice Midwife

## 2022-01-11 ENCOUNTER — Encounter: Payer: Self-pay | Admitting: Advanced Practice Midwife

## 2022-01-11 ENCOUNTER — Encounter: Payer: Medicaid Other | Admitting: Advanced Practice Midwife

## 2022-01-11 VITALS — BP 114/64 | HR 105 | Wt 246.0 lb

## 2022-01-11 DIAGNOSIS — O24410 Gestational diabetes mellitus in pregnancy, diet controlled: Secondary | ICD-10-CM

## 2022-01-11 DIAGNOSIS — Z3A32 32 weeks gestation of pregnancy: Secondary | ICD-10-CM

## 2022-01-11 LAB — POCT URINALYSIS DIPSTICK OB
Bilirubin, UA: NEGATIVE
Blood, UA: NEGATIVE
Glucose, UA: NEGATIVE
Ketones, UA: NEGATIVE
Nitrite, UA: NEGATIVE
Spec Grav, UA: 1.01 (ref 1.010–1.025)
Urobilinogen, UA: 0.2 E.U./dL
pH, UA: 6.5 (ref 5.0–8.0)

## 2022-01-11 NOTE — Progress Notes (Signed)
   PRENATAL VISIT NOTE  Subjective:  Kristen [name] is a 31 y.o. G3P2002 at [redacted]w[redacted]d being seen today for ongoing prenatal care.  She is currently monitored for the following issues for this high-risk pregnancy and has Sickle cell trait ([facility]); Supervision of other normal pregnancy, antepartum; Obesity affecting pregnancy, antepartum; Gestational diabetes; and Acid reflux on their problem list.  Patient reports no complaints.  Contractions: Not present. Vag. Bleeding: None.  Movement: Present. Denies leaking of fluid.   The following portions of the patient's history were reviewed and updated as appropriate: allergies, current medications, past family history, past medical history, past social history, past surgical history and problem list.   Objective:   Vitals:   01/11/22 0815  BP: 114/64  Pulse: (!) 105  Weight: 246 lb (111.6 kg)    Fetal Status: Fetal Heart Rate (bpm): 155   Movement: Present     General:  Alert, oriented and cooperative. Patient is in no acute distress.  Skin: Skin is warm and dry. No rash noted.   Cardiovascular: Normal heart rate noted  Respiratory: Normal respiratory effort, no problems with respiration noted  Abdomen: Soft, gravid, appropriate for gestational age.  Pain/Pressure: Present     Pelvic: Cervical exam deferred        Extremities: Normal range of motion.  Edema: Trace  Mental Status: Normal mood and affect. Normal behavior. Normal judgment and thought content.   Assessment and Plan:  Pregnancy: G3P2002 at [redacted]w[redacted]d 1. [redacted] weeks gestation of pregnancy      Growth [place] scheduled per MFM for December - POC Urinalysis Dipstick OB  2. Diet controlled gestational diabetes mellitus (GDM), antepartum Most FBS under 90, all under 95 recently Most 2 hr PC levels within range.  2-3 values in 150s with known "cheats" but overall most within range  - POC Urinalysis Dipstick OB  Preterm labor symptoms and general obstetric precautions including but not limited to  vaginal bleeding, contractions, leaking of fluid and fetal movement were reviewed in detail with the patient. Please refer to After Visit Summary for other counseling recommendations.   Return in about 2 weeks (around 01/25/2022) for [facility].  Future Appointments  Date Time Provider Department Center  01/25/2022  8:15 AM Aviva Signs, CNM [facility] None  02/07/2022  8:30 AM [facility] NURSE [facility]  02/07/2022  8:45 AM [facility] US6 [facility]  02/08/2022  8:35 AM Gerrit Heck, CNM [facility] None  02/15/2022  8:15 AM Aviva Signs, CNM [facility] None  02/23/2022  8:55 AM Adrian Blackwater, Rhona Raider, DO [facility] None    Wynelle Bourgeois, CNM

## 2022-01-25 ENCOUNTER — Ambulatory Visit (INDEPENDENT_AMBULATORY_CARE_PROVIDER_SITE_OTHER): Payer: Medicaid Other | Admitting: Advanced Practice Midwife

## 2022-01-25 ENCOUNTER — Encounter: Payer: Medicaid Other | Admitting: Advanced Practice Midwife

## 2022-01-25 ENCOUNTER — Encounter: Payer: Self-pay | Admitting: Advanced Practice Midwife

## 2022-01-25 VITALS — BP 111/73 | HR 125 | Wt 245.0 lb

## 2022-01-25 DIAGNOSIS — Z3483 Encounter for supervision of other normal pregnancy, third trimester: Secondary | ICD-10-CM

## 2022-01-25 DIAGNOSIS — Z3480 Encounter for supervision of other normal pregnancy, unspecified trimester: Secondary | ICD-10-CM

## 2022-01-25 DIAGNOSIS — Z3A34 34 weeks gestation of pregnancy: Secondary | ICD-10-CM

## 2022-01-25 DIAGNOSIS — O24410 Gestational diabetes mellitus in pregnancy, diet controlled: Secondary | ICD-10-CM

## 2022-01-25 LAB — POCT URINALYSIS DIPSTICK OB
Bilirubin, UA: NEGATIVE
Blood, UA: NEGATIVE
Glucose, UA: NEGATIVE
Ketones, UA: NEGATIVE
Leukocytes, UA: NEGATIVE
Nitrite, UA: NEGATIVE
Spec Grav, UA: 1.01 (ref 1.010–1.025)
Urobilinogen, UA: 0.2 E.U./dL
pH, UA: 6.5 (ref 5.0–8.0)

## 2022-01-25 NOTE — Progress Notes (Addendum)
   PRENATAL VISIT NOTE  Subjective:  Kristen [name] is a 31 y.o. G3P2002 at [redacted]w[redacted]d being seen today for ongoing prenatal care.  She is currently monitored for the following issues for this high-risk pregnancy and has Sickle cell trait ([facility]); Supervision of other normal pregnancy, antepartum; Obesity affecting pregnancy, antepartum; Gestational diabetes; and Acid reflux on their problem list.  Patient reports occasional contractions.  Contractions: Not present. Vag. Bleeding: None.  Movement: Present. Denies leaking of fluid.  FBS 80-90 PP glucoses less than 120  The following portions of the patient's history were reviewed and updated as appropriate: allergies, current medications, past family history, past medical history, past social history, past surgical history and problem list.   Objective:   Vitals:   01/25/22 0817  BP: 111/73  Pulse: (!) 125  Weight: 245 lb (111.1 kg)    Fetal Status: Fetal Heart Rate (bpm): 153   Movement: Present     General:  Alert, oriented and cooperative. Patient is in no acute distress.  Skin: Skin is warm and dry. No rash noted.   Cardiovascular: Normal heart rate noted  Respiratory: Normal respiratory effort, no problems with respiration noted  Abdomen: Soft, gravid, appropriate for gestational age.  Pain/Pressure: Present     Pelvic: Cervical exam deferred        Extremities: Normal range of motion.  Edema: None  Mental Status: Normal mood and affect. Normal behavior. Normal judgment and thought content.   Assessment and Plan:  Pregnancy: G3P2002 at [redacted]w[redacted]d 1. [redacted] weeks gestation of pregnancy     Feels some pressure and tightening.     Resting a lot at home while caring for other children  2. Supervision of other normal pregnancy, antepartum   3. Diet controlled gestational diabetes mellitus (GDM), antepartum     States BS are well controlled     Has growth [place] scheduled 02/07/22  Preterm labor symptoms and general obstetric precautions  including but not limited to vaginal bleeding, contractions, leaking of fluid and fetal movement were reviewed in detail with the patient. Please refer to After Visit Summary for other counseling recommendations.   Return in about 2 weeks (around 02/08/2022) for [facility].  Future Appointments  Date Time Provider Department Center  02/07/2022  8:30 AM [facility] NURSE [facility]  02/07/2022  8:45 AM [facility] US6 [facility]  02/08/2022  8:35 AM Gerrit Heck, CNM [facility] None  02/15/2022  8:15 AM Aviva Signs, CNM [facility] None  02/23/2022  8:55 AM Adrian Blackwater, Rhona Raider, DO [facility] None    Wynelle Bourgeois, CNM

## 2022-01-25 NOTE — Addendum Note (Signed)
Addended by: Lorelle Gibbs L on: 01/25/2022 09:10 AM   Modules accepted: Orders

## 2022-01-30 ENCOUNTER — Inpatient Hospital Stay (HOSPITAL_BASED_OUTPATIENT_CLINIC_OR_DEPARTMENT_OTHER): Payer: Medicaid Other

## 2022-01-30 ENCOUNTER — Inpatient Hospital Stay (HOSPITAL_COMMUNITY)
Admission: AD | Admit: 2022-01-30 | Discharge: 2022-01-30 | Disposition: A | Discharge status: Home / Self Care | Payer: Medicaid Other | Attending: Obstetrics and Gynecology | Admitting: Obstetrics and Gynecology

## 2022-01-30 ENCOUNTER — Other Ambulatory Visit: Payer: Self-pay

## 2022-01-30 ENCOUNTER — Encounter (HOSPITAL_COMMUNITY): Payer: Self-pay | Admitting: Obstetrics and Gynecology

## 2022-01-30 DIAGNOSIS — Z3A35 35 weeks gestation of pregnancy: Secondary | ICD-10-CM

## 2022-01-30 DIAGNOSIS — Z362 Encounter for other antenatal screening follow-up: Secondary | ICD-10-CM

## 2022-01-30 DIAGNOSIS — O99013 Anemia complicating pregnancy, third trimester: Secondary | ICD-10-CM | POA: Diagnosis not present

## 2022-01-30 DIAGNOSIS — O368130 Decreased fetal movements, third trimester, not applicable or unspecified: Secondary | ICD-10-CM | POA: Diagnosis present

## 2022-01-30 DIAGNOSIS — O24410 Gestational diabetes mellitus in pregnancy, diet controlled: Secondary | ICD-10-CM

## 2022-01-30 DIAGNOSIS — Z3689 Encounter for other specified antenatal screening: Secondary | ICD-10-CM | POA: Diagnosis not present

## 2022-01-30 DIAGNOSIS — Z3493 Encounter for supervision of normal pregnancy, unspecified, third trimester: Secondary | ICD-10-CM

## 2022-01-30 DIAGNOSIS — O99213 Obesity complicating pregnancy, third trimester: Secondary | ICD-10-CM | POA: Diagnosis not present

## 2022-01-30 DIAGNOSIS — E669 Obesity, unspecified: Secondary | ICD-10-CM | POA: Diagnosis not present

## 2022-01-30 DIAGNOSIS — Z3480 Encounter for supervision of other normal pregnancy, unspecified trimester: Secondary | ICD-10-CM

## 2022-01-30 DIAGNOSIS — D573 Sickle-cell trait: Secondary | ICD-10-CM

## 2022-01-30 HISTORY — DX: Type 2 diabetes mellitus without complications: E11.9

## 2022-01-30 NOTE — MAU Provider Note (Signed)
History     CSN: [id number]  Arrival date and time: 01/30/22 1635   Event Date/Time   First Provider Initiated Contact with Patient 01/30/22 1856      Chief Complaint  Patient presents with   Decreased Fetal Movement   Kristen [name] , a  31 y.o. G3P2002 at [redacted]w[redacted]d presents to MAU with complaints of decreased fetal movement since this am. Patient states she attempted at hot shower eating something, resting and pressing on her belly, all without improvement. Patient denies contractions, vaginal bleeding and leaking of fluid.          OB History     Gravida  3   Para  2   Term  2   Preterm  0   AB  0   Living  2      SAB  0   IAB  0   Ectopic  0   Multiple  0   Live Births  2           Past Medical History:  Diagnosis Date   Bacterial vaginitis 07/01/2016   Diabetes mellitus without complication (HCC)    Medical history non-contributory     Past Surgical History:  Procedure Laterality Date   NO PAST SURGERIES      Family History  Problem Relation Age of Onset   Heart disease Father    Asthma Neg Hx    Birth defects Neg Hx    Cancer Neg Hx    Diabetes Neg Hx    Hypertension Neg Hx    Stroke Neg Hx     Social History   Tobacco Use   Smoking status: Never   Smokeless tobacco: Never  Vaping Use   Vaping Use: Never used  Substance Use Topics   Alcohol use: Not Currently    Comment: not while preg   Drug use: No    Allergies: No Known Allergies  Medications Prior to Admission  Medication Sig Dispense Refill Last Dose   aspirin 81 MG chewable tablet Chew 1 tablet (81 mg total) by mouth daily. 30 tablet 8 01/30/2022 at 1000   pantoprazole (PROTONIX) 20 MG tablet Take 1 tablet (20 mg total) by mouth daily. 30 tablet 2 01/29/2022   Prenatal Vit-Fe Fumarate-FA (PRENATAL VITAMIN) 27-0.8 MG TABS Take 1 tablet by mouth daily. 30 tablet 11 01/30/2022 at 1000   Accu-Chek Softclix Lancets lancets Use as instructed 100 each 12    Blood Glucose  Monitoring Suppl (ACCU-CHEK GUIDE) w/Device KIT 1 each by Does not apply route 4 (four) times daily. 1 kit 0    Doxylamine-Pyridoxine (DICLEGIS) 10-10 MG TBEC Take 2 tablets by mouth at bedtime. If symptoms persist, add one tablet in the morning and one in the afternoon (Patient not taking: Reported on 12/28/2021) 100 tablet 5    glucose blood (ACCU-CHEK GUIDE) test strip Use as instructed 100 each 12    ondansetron (ZOFRAN-ODT) 4 MG disintegrating tablet Take 1 tablet (4 mg total) by mouth every 6 (six) hours as needed for nausea. (Patient not taking: Reported on 01/11/2022) 20 tablet 0     Review of Systems  Constitutional:  Negative for chills, fatigue and fever.  Eyes:  Negative for pain and visual disturbance.  Respiratory:  Negative for apnea, shortness of breath and wheezing.   Cardiovascular:  Negative for chest pain and palpitations.  Gastrointestinal:  Negative for abdominal pain, constipation, diarrhea, nausea and vomiting.  Genitourinary:  Negative for difficulty urinating, dysuria, pelvic pain, vaginal   bleeding, vaginal discharge and vaginal pain.  Musculoskeletal:  Negative for back pain.  Neurological:  Negative for seizures, weakness and headaches.  Psychiatric/Behavioral:  Negative for suicidal ideas.    Physical Exam   Blood pressure 122/74, pulse (!) 103, resp. rate 19, height 5' 5" (1.651 m), weight 112.4 kg, last menstrual period 05/12/2021, SpO2 98 %, currently breastfeeding.  Physical Exam Vitals and nursing note reviewed.  Constitutional:      General: She is not in acute distress.    Appearance: Normal appearance.  HENT:     Head: Normocephalic.  Pulmonary:     Effort: Pulmonary effort is normal.  Abdominal:     Palpations: Abdomen is soft.     Comments: Fetal movement present on palpation  Musculoskeletal:     Cervical back: Normal range of motion.  Skin:    General: Skin is warm and dry.  Neurological:     Mental Status: She is alert and oriented to  person, place, and time.  Psychiatric:        Mood and Affect: Mood normal.    FHT: 150bpm with moderate variability 15x15 accels present no decels - Toco occasional US (patient unaware).     MAU Course   Orders Placed This Encounter  Procedures   US MFM FETAL BPP WO NON STRESS   Discharge patient   - Patient clicker provided.    MDM - NST reactive and reassuring  - PreLim BPP results revealed 1 fetus with cardiac activity in cephalic presentation. Normal AFI, fetal tone, movement and breathing observed for a 8/8 BPP.  - Patient hit fetal movement clicker >20 times while in MAU.  - Plan for discharge.    Assessment and Plan   1. Movement of fetus present during pregnancy in third trimester   2. Supervision of other normal pregnancy, antepartum   3. Diet controlled gestational diabetes mellitus (GDM) in third trimester   4. NST (non-stress test) reactive   5. [redacted] weeks gestation of pregnancy    - Reviewed fetal kick counts in 3rd trimester of pregnancy.  - Patient reassured by fetal movement felt while in MAU and by US findings. - Worsening signs and return precautions reviewed.  - FHT Cat I upon discharge and patient feeling frequent and vigorous fetal movement upon discharge.  - Preterm labor precautions reviewed.  - Patient discharged home in stable condition and may return to MAU as needed.   Kristen M Payne, MSN CNM  01/30/2022, 6:56 PM  

## 2022-01-30 NOTE — MAU Note (Signed)
Kristen [name] is a 31 y.o. at [redacted]w[redacted]d here in MAU reporting: she hasn't had any FM today, reports last movements felt were last night.  Denies ctxs, VB or LOF. LMP: NA Onset of complaint: today Pain score: 0 Vitals:   01/30/22 1649  BP: 130/68  Pulse: (!) 106  Resp: 19  SpO2: 98%     FHT:152 bpm Lab orders placed from triage:   None

## 2022-02-07 ENCOUNTER — Ambulatory Visit: Payer: Medicaid Other | Admitting: *Deleted

## 2022-02-07 ENCOUNTER — Ambulatory Visit: Payer: Medicaid Other | Attending: Obstetrics and Gynecology

## 2022-02-07 VITALS — BP 127/75 | HR 91

## 2022-02-07 DIAGNOSIS — Z3A36 36 weeks gestation of pregnancy: Secondary | ICD-10-CM | POA: Insufficient documentation

## 2022-02-07 DIAGNOSIS — O99013 Anemia complicating pregnancy, third trimester: Secondary | ICD-10-CM | POA: Diagnosis not present

## 2022-02-07 DIAGNOSIS — O99019 Anemia complicating pregnancy, unspecified trimester: Secondary | ICD-10-CM | POA: Insufficient documentation

## 2022-02-07 DIAGNOSIS — Z3480 Encounter for supervision of other normal pregnancy, unspecified trimester: Secondary | ICD-10-CM

## 2022-02-07 DIAGNOSIS — E669 Obesity, unspecified: Secondary | ICD-10-CM

## 2022-02-07 DIAGNOSIS — O99213 Obesity complicating pregnancy, third trimester: Secondary | ICD-10-CM | POA: Insufficient documentation

## 2022-02-07 DIAGNOSIS — Z862 Personal history of diseases of the blood and blood-forming organs and certain disorders involving the immune mechanism: Secondary | ICD-10-CM | POA: Insufficient documentation

## 2022-02-07 DIAGNOSIS — D573 Sickle-cell trait: Secondary | ICD-10-CM | POA: Diagnosis not present

## 2022-02-07 DIAGNOSIS — O24410 Gestational diabetes mellitus in pregnancy, diet controlled: Secondary | ICD-10-CM

## 2022-02-07 DIAGNOSIS — Z362 Encounter for other antenatal screening follow-up: Secondary | ICD-10-CM | POA: Insufficient documentation

## 2022-02-08 ENCOUNTER — Other Ambulatory Visit (HOSPITAL_COMMUNITY)
Admission: RE | Admit: 2022-02-08 | Discharge: 2022-02-08 | Disposition: A | Discharge status: Home / Self Care | Payer: Medicaid Other | Source: Ambulatory Visit

## 2022-02-08 ENCOUNTER — Ambulatory Visit (INDEPENDENT_AMBULATORY_CARE_PROVIDER_SITE_OTHER): Payer: Medicaid Other

## 2022-02-08 VITALS — BP 124/78 | HR 101 | Wt 247.0 lb

## 2022-02-08 DIAGNOSIS — Z3A36 36 weeks gestation of pregnancy: Secondary | ICD-10-CM | POA: Diagnosis present

## 2022-02-08 DIAGNOSIS — Z3480 Encounter for supervision of other normal pregnancy, unspecified trimester: Secondary | ICD-10-CM | POA: Insufficient documentation

## 2022-02-08 DIAGNOSIS — Z3483 Encounter for supervision of other normal pregnancy, third trimester: Secondary | ICD-10-CM

## 2022-02-08 DIAGNOSIS — O24410 Gestational diabetes mellitus in pregnancy, diet controlled: Secondary | ICD-10-CM

## 2022-02-08 NOTE — Progress Notes (Signed)
   HIGH-RISK PREGNANCY OFFICE VISIT  Patient name: Kristen [name] MRN [id number]  Date of birth: [birth year] Chief Complaint:   No chief complaint on file.  Subjective:   Kristen [name] is a 31 y.o. G76P2002 female at [redacted]w[redacted]d with an Estimated Date of Delivery: 03/04/22 being seen today for ongoing management of a high-risk pregnancy aeb has Sickle cell trait (HCC); Supervision of other normal pregnancy, antepartum; Obesity affecting pregnancy, antepartum; Gestational diabetes; and Acid reflux on their problem list.  Patient presents today, alone, with no complaints.  Patient endorses fetal movement. Patient denies abdominal cramping or contractions.  Patient denies vaginal concerns including abnormal discharge, leaking of fluid, and bleeding No issues with urination, constipation, or diarrhea.   Contractions: Irritability. Vag. Bleeding: None.  Movement: Present.  Reviewed past medical,surgical, social, obstetrical and family history as well as problem list, medications and allergies.  Objective   Vitals:   02/08/22 0853  BP: 124/78  Pulse: (!) 101  Weight: 247 lb (112 kg)  Body mass index is 41.1 kg/m.  Total Weight Gain:32 lb (14.5 kg)         Physical Examination:   General appearance: Well appearing, and in no distress  Mental status: Alert, oriented to person, place, and time  Skin: Warm & dry  Cardiovascular: Normal heart rate noted  Respiratory: Normal respiratory effort, no distress  Abdomen: Soft, gravid, nontender, AGA with Fundal Height: 38 cm  Pelvic: Cervical exam performed  Dilation: Closed Effacement (%): Thick Station: Ballotable    Extremities: Edema: None  Fetal Status: Fetal Heart Rate (bpm): 145  Movement: Present   No results found for this or any previous visit (from the past 24 hour(s)).  Assessment & Plan:  High-risk pregnancy of a 31 y.o., G3P2002 at [redacted]w[redacted]d with an Estimated Date of Delivery: 03/04/22   1. Supervision of other normal pregnancy,  antepartum -Anticipatory guidance for upcoming appts. -Patient to schedule next appt in 1 weeks for an in-person visit. -Labs as below. -Educated on GBS bacteria including what it is, why we test, and how and when we treat if needed. -Discussed releasing of results to mychart. -Discussed and reviewed postpartum planning including contraception, pediatricians, and infant feedings and circumcision desires. *Patient desires to breastfeed and is considering depo for contraception.  2. Diet controlled gestational diabetes mellitus (GDM) in third trimester -Reports sugars are "pretty good." -12/4-10: *Fasting: 78-92 *BF: Does not eat *Lunch:76-124 *Dinner: 77-120 *Late Night: 91-120 -[place] yesterday: Normal growth in 40%ile. No additional follow up.   3. [redacted] weeks gestation of pregnancy -Doing well -No concerns.      Meds: No orders of the defined types were placed in this encounter.  Labs/procedures today:  Lab Orders         Culture, beta strep (group b only)      Reviewed: Preterm labor symptoms and general obstetric precautions including but not limited to vaginal bleeding, contractions, leaking of fluid and fetal movement were reviewed in detail with the patient.  All questions were answered.  Follow-up: No follow-ups on file.  Orders Placed This Encounter  Procedures   Culture, beta strep (group b only)   Cherre Robins MSN, CNM 02/08/2022

## 2022-02-09 LAB — GC/CHLAMYDIA PROBE AMP (~~LOC~~) NOT AT ARMC
Chlamydia: NEGATIVE
Comment: NEGATIVE
Comment: NORMAL
Neisseria Gonorrhea: NEGATIVE

## 2022-02-11 LAB — CULTURE, BETA STREP (GROUP B ONLY): Strep Gp B Culture: POSITIVE — AB

## 2022-02-15 ENCOUNTER — Ambulatory Visit (INDEPENDENT_AMBULATORY_CARE_PROVIDER_SITE_OTHER): Payer: Medicaid Other | Admitting: Family Medicine

## 2022-02-15 VITALS — BP 125/73 | HR 114 | Wt 247.0 lb

## 2022-02-15 DIAGNOSIS — Z3483 Encounter for supervision of other normal pregnancy, third trimester: Secondary | ICD-10-CM

## 2022-02-15 DIAGNOSIS — O99210 Obesity complicating pregnancy, unspecified trimester: Secondary | ICD-10-CM

## 2022-02-15 DIAGNOSIS — Z3A37 37 weeks gestation of pregnancy: Secondary | ICD-10-CM

## 2022-02-15 DIAGNOSIS — O24410 Gestational diabetes mellitus in pregnancy, diet controlled: Secondary | ICD-10-CM

## 2022-02-15 DIAGNOSIS — O99213 Obesity complicating pregnancy, third trimester: Secondary | ICD-10-CM

## 2022-02-15 DIAGNOSIS — D573 Sickle-cell trait: Secondary | ICD-10-CM

## 2022-02-15 DIAGNOSIS — O99820 Streptococcus B carrier state complicating pregnancy: Secondary | ICD-10-CM

## 2022-02-15 DIAGNOSIS — Z3480 Encounter for supervision of other normal pregnancy, unspecified trimester: Secondary | ICD-10-CM

## 2022-02-15 NOTE — Progress Notes (Signed)
   PRENATAL VISIT NOTE  Subjective:  Kristen [name] is a 31 y.o. G3P2002 at [redacted]w[redacted]d being seen today for ongoing prenatal care.  She is currently monitored for the following issues for this high-risk pregnancy and has Sickle cell trait (HCC); Group B Streptococcus carrier, +RV culture, currently pregnant; Supervision of other normal pregnancy, antepartum; Obesity affecting pregnancy, antepartum; Gestational diabetes; and Acid reflux on their problem list.  Patient reports no complaints.  Contractions: Irritability. Vag. Bleeding: None.  Movement: Present. Denies leaking of fluid.   The following portions of the patient's history were reviewed and updated as appropriate: allergies, current medications, past family history, past medical history, past social history, past surgical history and problem list.   Objective:   Vitals:   02/15/22 0826  BP: 125/73  Pulse: (!) 114  Weight: 247 lb (112 kg)    Fetal Status: Fetal Heart Rate (bpm): 152   Movement: Present     General:  Alert, oriented and cooperative. Patient is in no acute distress.  Skin: Skin is warm and dry. No rash noted.   Cardiovascular: Normal heart rate noted  Respiratory: Normal respiratory effort, no problems with respiration noted  Abdomen: Soft, gravid, appropriate for gestational age.  Pain/Pressure: Present     Pelvic: Cervical exam deferred        Extremities: Normal range of motion.  Edema: Trace  Mental Status: Normal mood and affect. Normal behavior. Normal judgment and thought content.   Assessment and Plan:  Pregnancy: G3P2002 at [redacted]w[redacted]d 1. [redacted] weeks gestation of pregnancy  2. Supervision of other normal pregnancy, antepartum FHT and FH normal  3. Diet controlled gestational diabetes mellitus (GDM) in third trimester controlled  4. Group B Streptococcus carrier, +RV culture, currently pregnant Intrapartum ppx  5. Sickle cell trait (HCC)  6. Obesity affecting pregnancy, antepartum, unspecified obesity  type  Preterm labor symptoms and general obstetric precautions including but not limited to vaginal bleeding, contractions, leaking of fluid and fetal movement were reviewed in detail with the patient. Please refer to After Visit Summary for other counseling recommendations.   No follow-ups on file.  Future Appointments  Date Time Provider Department Center  02/23/2022  8:55 AM Levie Heritage, DO [facility] None    Levie Heritage, DO

## 2022-02-23 ENCOUNTER — Inpatient Hospital Stay (HOSPITAL_COMMUNITY): Payer: Medicaid Other | Admitting: Anesthesiology

## 2022-02-23 ENCOUNTER — Inpatient Hospital Stay (HOSPITAL_COMMUNITY)
Admission: AD | Admit: 2022-02-23 | Discharge: 2022-02-25 | DRG: 807 | Disposition: A | Discharge status: Home / Self Care | Payer: Medicaid Other | Attending: Obstetrics and Gynecology | Admitting: Obstetrics and Gynecology

## 2022-02-23 ENCOUNTER — Other Ambulatory Visit: Payer: Self-pay

## 2022-02-23 ENCOUNTER — Encounter: Payer: Medicaid Other | Admitting: Family Medicine

## 2022-02-23 ENCOUNTER — Encounter (HOSPITAL_COMMUNITY): Payer: Self-pay | Admitting: Obstetrics and Gynecology

## 2022-02-23 DIAGNOSIS — O99210 Obesity complicating pregnancy, unspecified trimester: Secondary | ICD-10-CM | POA: Diagnosis present

## 2022-02-23 DIAGNOSIS — D573 Sickle-cell trait: Secondary | ICD-10-CM | POA: Diagnosis present

## 2022-02-23 DIAGNOSIS — O4292 Full-term premature rupture of membranes, unspecified as to length of time between rupture and onset of labor: Secondary | ICD-10-CM

## 2022-02-23 DIAGNOSIS — O24420 Gestational diabetes mellitus in childbirth, diet controlled: Principal | ICD-10-CM | POA: Diagnosis present

## 2022-02-23 DIAGNOSIS — Z7982 Long term (current) use of aspirin: Secondary | ICD-10-CM

## 2022-02-23 DIAGNOSIS — Z3A38 38 weeks gestation of pregnancy: Secondary | ICD-10-CM

## 2022-02-23 DIAGNOSIS — O99824 Streptococcus B carrier state complicating childbirth: Secondary | ICD-10-CM | POA: Diagnosis present

## 2022-02-23 DIAGNOSIS — O165 Unspecified maternal hypertension, complicating the puerperium: Secondary | ICD-10-CM | POA: Diagnosis present

## 2022-02-23 DIAGNOSIS — O99820 Streptococcus B carrier state complicating pregnancy: Secondary | ICD-10-CM

## 2022-02-23 DIAGNOSIS — O99214 Obesity complicating childbirth: Secondary | ICD-10-CM | POA: Diagnosis present

## 2022-02-23 DIAGNOSIS — O24419 Gestational diabetes mellitus in pregnancy, unspecified control: Secondary | ICD-10-CM | POA: Diagnosis present

## 2022-02-23 DIAGNOSIS — O9903 Anemia complicating the puerperium: Secondary | ICD-10-CM | POA: Diagnosis present

## 2022-02-23 DIAGNOSIS — O26893 Other specified pregnancy related conditions, third trimester: Secondary | ICD-10-CM | POA: Diagnosis present

## 2022-02-23 DIAGNOSIS — O4290 Premature rupture of membranes, unspecified as to length of time between rupture and onset of labor, unspecified weeks of gestation: Secondary | ICD-10-CM

## 2022-02-23 LAB — GLUCOSE, CAPILLARY
Glucose-Capillary: 95 mg/dL (ref 70–99)
Glucose-Capillary: 91 mg/dL (ref 70–99)

## 2022-02-23 LAB — CBC
HCT: 34.4 % — ABNORMAL LOW (ref 36.0–46.0)
Hemoglobin: 11.3 g/dL — ABNORMAL LOW (ref 12.0–15.0)
MCH: 26.2 pg (ref 26.0–34.0)
MCHC: 32.8 g/dL (ref 30.0–36.0)
MCV: 79.6 fL — ABNORMAL LOW (ref 80.0–100.0)
Platelets: 271 10*3/uL (ref 150–400)
RBC: 4.32 MIL/uL (ref 3.87–5.11)
RDW: 14.7 % (ref 11.5–15.5)
WBC: 5.1 10*3/uL (ref 4.0–10.5)
nRBC: 0 % (ref 0.0–0.2)

## 2022-02-23 LAB — TYPE AND SCREEN
ABO/RH(D): B POS
Antibody Screen: NEGATIVE

## 2022-02-23 LAB — POCT FERN TEST: POCT Fern Test: POSITIVE

## 2022-02-23 LAB — RPR: RPR Ser Ql: NONREACTIVE

## 2022-02-23 MED ORDER — OXYTOCIN BOLUS FROM INFUSION
333.0000 mL | Freq: Once | INTRAVENOUS | Status: AC
  Administered 2022-02-23: 333 mL via INTRAVENOUS

## 2022-02-23 MED ORDER — ZOLPIDEM TARTRATE 5 MG PO TABS: 5.0000 mg | ORAL_TABLET | Freq: Every evening | ORAL | Status: DC | PRN

## 2022-02-23 MED ORDER — SOD CITRATE-CITRIC ACID 500-334 MG/5ML PO SOLN: 30.0000 mL | ORAL | Status: DC | PRN

## 2022-02-23 MED ORDER — PHENYLEPHRINE 80 MCG/ML (10ML) SYRINGE FOR IV PUSH (FOR BLOOD PRESSURE SUPPORT): 80.0000 ug | PREFILLED_SYRINGE | INTRAVENOUS | Status: DC | PRN

## 2022-02-23 MED ORDER — EPHEDRINE 5 MG/ML INJ: 10.0000 mg | INTRAVENOUS | Status: DC | PRN

## 2022-02-23 MED ORDER — OXYTOCIN-SODIUM CHLORIDE 30-0.9 UT/500ML-% IV SOLN
2.5000 [IU]/h | INTRAVENOUS | Status: DC
  Administered 2022-02-23: 2.5 [IU]/h via INTRAVENOUS
  Filled 2022-02-23: qty 500

## 2022-02-23 MED ORDER — SODIUM CHLORIDE 0.9 % IV SOLN
5.0000 10*6.[IU] | Freq: Once | INTRAVENOUS | Status: AC
  Administered 2022-02-23: 5 10*6.[IU] via INTRAVENOUS
  Filled 2022-02-23: qty 5

## 2022-02-23 MED ORDER — OXYCODONE-ACETAMINOPHEN 5-325 MG PO TABS: 1.0000 | ORAL_TABLET | ORAL | Status: DC | PRN

## 2022-02-23 MED ORDER — SENNOSIDES-DOCUSATE SODIUM 8.6-50 MG PO TABS
2.0000 | ORAL_TABLET | Freq: Every day | ORAL | Status: DC
  Administered 2022-02-24 – 2022-02-25 (×2): 2 via ORAL
  Filled 2022-02-23 (×2): qty 2

## 2022-02-23 MED ORDER — ONDANSETRON HCL 4 MG/2ML IJ SOLN: 4.0000 mg | INTRAMUSCULAR | Status: DC | PRN

## 2022-02-23 MED ORDER — DIBUCAINE (PERIANAL) 1 % EX OINT: 1.0000 | TOPICAL_OINTMENT | CUTANEOUS | Status: DC | PRN

## 2022-02-23 MED ORDER — LACTATED RINGERS IV SOLN: INTRAVENOUS | Status: DC

## 2022-02-23 MED ORDER — LIDOCAINE HCL (PF) 1 % IJ SOLN: 30.0000 mL | INTRAMUSCULAR | Status: DC | PRN

## 2022-02-23 MED ORDER — ACETAMINOPHEN 325 MG PO TABS
650.0000 mg | ORAL_TABLET | ORAL | Status: DC | PRN
  Administered 2022-02-23: 650 mg via ORAL
  Filled 2022-02-23: qty 2

## 2022-02-23 MED ORDER — FENTANYL-BUPIVACAINE-NACL 0.5-0.125-0.9 MG/250ML-% EP SOLN
12.0000 mL/h | EPIDURAL | Status: DC | PRN
  Administered 2022-02-23: 12 mL/h via EPIDURAL
  Filled 2022-02-23: qty 250

## 2022-02-23 MED ORDER — PENICILLIN G POT IN DEXTROSE 60000 UNIT/ML IV SOLN: 3.0000 10*6.[IU] | INTRAVENOUS | Status: DC

## 2022-02-23 MED ORDER — DIPHENHYDRAMINE HCL 50 MG/ML IJ SOLN: 12.5000 mg | INTRAMUSCULAR | Status: DC | PRN

## 2022-02-23 MED ORDER — IBUPROFEN 600 MG PO TABS
600.0000 mg | ORAL_TABLET | Freq: Four times a day (QID) | ORAL | Status: DC
  Administered 2022-02-23 – 2022-02-25 (×8): 600 mg via ORAL
  Filled 2022-02-23 (×8): qty 1

## 2022-02-23 MED ORDER — PRENATAL MULTIVITAMIN CH
1.0000 | ORAL_TABLET | Freq: Every day | ORAL | Status: DC
  Administered 2022-02-23 – 2022-02-25 (×3): 1 via ORAL
  Filled 2022-02-23 (×3): qty 1

## 2022-02-23 MED ORDER — DIPHENHYDRAMINE HCL 25 MG PO CAPS: 25.0000 mg | ORAL_CAPSULE | Freq: Four times a day (QID) | ORAL | Status: DC | PRN

## 2022-02-23 MED ORDER — LIDOCAINE HCL (PF) 1 % IJ SOLN
INTRAMUSCULAR | Status: DC | PRN
  Administered 2022-02-23: 11 mL via EPIDURAL

## 2022-02-23 MED ORDER — LACTATED RINGERS IV SOLN
500.0000 mL | Freq: Once | INTRAVENOUS | Status: AC
  Administered 2022-02-23: 500 mL via INTRAVENOUS

## 2022-02-23 MED ORDER — ONDANSETRON HCL 4 MG/2ML IJ SOLN: 4.0000 mg | Freq: Four times a day (QID) | INTRAMUSCULAR | Status: DC | PRN

## 2022-02-23 MED ORDER — HYDROXYZINE HCL 50 MG PO TABS: 50.0000 mg | ORAL_TABLET | Freq: Four times a day (QID) | ORAL | Status: DC | PRN

## 2022-02-23 MED ORDER — TETANUS-DIPHTH-ACELL PERTUSSIS 5-2.5-18.5 LF-MCG/0.5 IM SUSY: 0.5000 mL | PREFILLED_SYRINGE | Freq: Once | INTRAMUSCULAR | Status: DC

## 2022-02-23 MED ORDER — OXYCODONE-ACETAMINOPHEN 5-325 MG PO TABS: 2.0000 | ORAL_TABLET | ORAL | Status: DC | PRN

## 2022-02-23 MED ORDER — LACTATED RINGERS IV SOLN: 500.0000 mL | INTRAVENOUS | Status: DC | PRN

## 2022-02-23 MED ORDER — ONDANSETRON HCL 4 MG PO TABS: 4.0000 mg | ORAL_TABLET | ORAL | Status: DC | PRN

## 2022-02-23 MED ORDER — FENTANYL CITRATE (PF) 100 MCG/2ML IJ SOLN: 100.0000 ug | INTRAMUSCULAR | Status: DC | PRN

## 2022-02-23 MED ORDER — BENZOCAINE-MENTHOL 20-0.5 % EX AERO
1.0000 | INHALATION_SPRAY | CUTANEOUS | Status: DC | PRN
  Administered 2022-02-23: 1 via TOPICAL
  Filled 2022-02-23 (×2): qty 56

## 2022-02-23 MED ORDER — SALINE SPRAY 0.65 % NA SOLN
1.0000 | NASAL | Status: DC | PRN
  Filled 2022-02-23: qty 44

## 2022-02-23 MED ORDER — ACETAMINOPHEN 325 MG PO TABS: 650.0000 mg | ORAL_TABLET | ORAL | Status: DC | PRN

## 2022-02-23 MED ORDER — SIMETHICONE 80 MG PO CHEW: 80.0000 mg | CHEWABLE_TABLET | ORAL | Status: DC | PRN

## 2022-02-23 MED ORDER — FLEET ENEMA 7-19 GM/118ML RE ENEM: 1.0000 | ENEMA | RECTAL | Status: DC | PRN

## 2022-02-23 MED ORDER — WITCH HAZEL-GLYCERIN EX PADS: 1.0000 | MEDICATED_PAD | CUTANEOUS | Status: DC | PRN

## 2022-02-23 MED ORDER — COCONUT OIL OIL: 1.0000 | TOPICAL_OIL | Status: DC | PRN

## 2022-02-23 NOTE — Discharge Summary (Signed)
Postpartum Discharge Summary    Patient Name: Kristen [name] DOB: 1990/07/22 MRN: [id number]  Date of admission: 02/23/2022 Delivery date:02/23/2022  Delivering provider: Renee Harder  Date of discharge: 02/25/2022  Admitting diagnosis: Leakage, amniotic fluid [O42.90] Intrauterine pregnancy: [redacted]w[redacted]d    Secondary diagnosis:  Principal Problem:   SVD (spontaneous vaginal delivery) Active Problems:   Group B Streptococcus carrier, +RV culture, currently pregnant   Obesity affecting pregnancy, antepartum   Gestational diabetes   Leakage, amniotic fluid  Additional problems: PP HTN    Discharge diagnosis: Term Pregnancy Delivered                                              Post partum procedures: n/a Augmentation: N/A Complications: None  Hospital course: Onset of Labor With Vaginal Delivery      31y.o. yo [id number] admitted in Latent Labor on 02/23/2022. Labor course was complicated by none  Membrane Rupture Time/Date: 5:30 AM ,02/23/2022   Delivery Method:Vaginal, Spontaneous  Episiotomy: None  Lacerations:  None  Patient had a postpartum course complicated by HTN. Pt started on procardia and lasix.  She is ambulating, tolerating a regular diet, passing flatus, and urinating well. Patient is discharged home in stable condition on 02/25/22.  Newborn Data: Birth date:02/23/2022  Birth time:11:23 AM  Gender:Female  Living status:Living  Apgars:8 ,9  Weight:3180 g   Magnesium Sulfate received: No BMZ received: No Rhophylac:N/A MMR:N/A T-DaP:Given prenatally Flu: No Transfusion:No  Physical exam  Vitals:   02/24/22 1451 02/24/22 2017 02/24/22 2110 02/25/22 0510  BP: 123/84 134/85 128/82 127/73  Pulse: 95 (!) 115 87 84  Resp: _0 Temp: 98.2 F (36.8 C) 98.3 F (36.8 C)  98 F (36.7 C)  TempSrc: Oral Oral  Oral  SpO2: 100% 100%  100%  Weight:      Height:       General: alert, cooperative, and no distress Lochia: appropriate Uterine  Fundus: firm Incision: N/A DVT Evaluation: No evidence of DVT seen on physical exam. Labs: Lab Results  Component Value Date   WBC 5.1 02/23/2022   HGB 11.3 (L) 02/23/2022   HCT 34.4 (L) 02/23/2022   MCV 79.6 (L) 02/23/2022   PLT 271 02/23/2022      Latest Ref Rng & Units 02/24/2022    5:02 AM  CMP  Glucose 70 - 99 mg/dL 103   BUN 6 - 20 mg/dL <5   Creatinine 0.44 - 1.00 mg/dL 0.79   Sodium 135 - 145 mmol/L 136   Potassium 3.5 - 5.1 mmol/L 3.6   Chloride 98 - 111 mmol/L 109   CO2 22 - 32 mmol/L 20   Calcium 8.9 - 10.3 mg/dL 8.5   Total Protein 6.5 - 8.1 g/dL 5.6   Total Bilirubin 0.3 - 1.2 mg/dL 0.3   Alkaline Phos 38 - 126 U/L 94   AST 15 - 41 U/L 22   ALT 0 - 44 U/L 13    Edinburgh Score:    02/23/2022    3:48 PM  Edinburgh Postnatal Depression Scale Screening Tool  I have been able to laugh and see the funny side of things. 0  I have looked forward with enjoyment to things. 0  I have blamed myself unnecessarily when things went wrong. 0  I have been anxious or worried for no good  reason. 0  I have felt scared or panicky for no good reason. 0  Things have been getting on top of me. 0  I have been so unhappy that I have had difficulty sleeping. 0  I have felt sad or miserable. 0  I have been so unhappy that I have been crying. 0  The thought of harming myself has occurred to me. 0  Edinburgh Postnatal Depression Scale Total 0     After visit meds:  Allergies as of 02/25/2022   No Known Allergies      Medication List     STOP taking these medications    Accu-Chek Guide test strip Generic drug: glucose blood   Accu-Chek Guide w/Device Kit   Accu-Chek Softclix Lancets lancets   aspirin 81 MG chewable tablet   Doxylamine-Pyridoxine 10-10 MG Tbec Commonly known as: Diclegis   ondansetron 4 MG disintegrating tablet Commonly known as: ZOFRAN-ODT   pantoprazole 20 MG tablet Commonly known as: Protonix   Prenatal Vitamin 27-0.8 MG Tabs        TAKE these medications    acetaminophen 325 MG tablet Commonly known as: Tylenol Take 2 tablets (650 mg total) by mouth every 4 (four) hours as needed (for pain scale < 4).   benzocaine-Menthol 20-0.5 % Aero Commonly known as: DERMOPLAST Apply 1 Application topically as needed for irritation (perineal discomfort).   dextromethorphan-guaiFENesin 30-600 MG 12hr tablet Commonly known as: MUCINEX DM Take 1 tablet by mouth 2 (two) times daily for 5 days.   furosemide 20 MG tablet Commonly known as: LASIX Take 1 tablet (20 mg total) by mouth daily for 3 days. Start taking on: February 26, 2022   ibuprofen 600 MG tablet Commonly known as: ADVIL Take 1 tablet (600 mg total) by mouth every 6 (six) hours.   NIFEdipine 30 MG 24 hr tablet Commonly known as: ADALAT CC Take 1 tablet (30 mg total) by mouth daily. Start taking on: February 26, 2022   senna-docusate 8.6-50 MG tablet Commonly known as: Senokot-S Take 2 tablets by mouth daily. Start taking on: February 26, 2022         Discharge home in stable condition Infant Feeding: Bottle and Breast Infant Disposition:home with mother Discharge instruction: per After Visit Summary and Postpartum booklet. Activity: Advance as tolerated. Pelvic rest for 6 weeks.  Diet: routine diet Future Appointments: Future Appointments  Date Time Provider [place]  03/29/2022  8:35 AM Seabron Spates, CNM [facility] None   Follow up Visit:  Message sent [facility] on 02/23/22  Please schedule this patient for a In person postpartum visit in 6 weeks with the following provider: Any provider. Additional Postpartum F/U:2 hour GTT, BP in 1 week. Low risk pregnancy complicated by: GDM Delivery mode:  Vaginal, Spontaneous  Anticipated Birth Control:  Depo   02/25/2022 Shelda Pal, DO

## 2022-02-23 NOTE — Progress Notes (Signed)
Kristen [name] is a 31 y.o. G3P2002 at [redacted]w[redacted]d admitted for rupture of membranes  Subjective: Patient reports feeling a lot of rectal pressure.   Objective: BP 105/86   Pulse 89   Temp (!) 97.5 F (36.4 C) (Oral)   Resp 16   Ht 5\' 5"  (1.651 m)   Wt 109.7 kg   LMP 05/12/2021   SpO2 99%   BMI 40.24 kg/m  No intake/output data recorded. Total I/O In: -  Out: 200 [Urine:200]  FHT: 145 bpm, moderate variability, +10x10 accels, +occasional variable decel UC: Q 05/14/2021 SVE:   Dilation: 9 Effacement (%): 90 Station: 0, Plus 1 Exam by:: [email or web address], CNM  Labs: Lab Results  Component Value Date   WBC 5.1 02/23/2022   HGB 11.3 (L) 02/23/2022   HCT 34.4 (L) 02/23/2022   MCV 79.6 (L) 02/23/2022   PLT 271 02/23/2022    Assessment / Plan: Cait Locust is a 31 y.o. G3P2002 at [redacted]w[redacted]d admitted for SROM  Labor: Patient progressing A1GDM: CBG 95 on admission Fetal Wellbeing:  Category II Pain Control:  Epidural I/D:  GBS Pos > PCN Anticipated MOD:  NSVD   [redacted]w[redacted]d, CNM 02/23/2022, 10:59 AM

## 2022-02-23 NOTE — H&P (Signed)
Kristen [name] is a 31 y.o. G93P2002 female at 65w5dby 8wk u/s presenting w/ SROM 0530, clear fluid. Painful contractions q 5106m since.  Denies ha, visual changes, ruq/epigastric pain, n/v.   Reports active fetal movement, contractions: regular, vaginal bleeding: none, membranes: ruptured, clear fluid.  Initiated prenatal care at [facility] at 12 wks.   Most recent u/s 12/11 @ 36.3w, EFW 40%/2815g/6lb3oz.   This pregnancy complicated by: A1U3AGbesity, BMI 40 Sickle cell trait (FOB neg) 2 copies SMN1  Prenatal History/Complications:  Term SVB x 2 (GHTN 1st pregnancy)  Past Medical History: Past Medical History:  Diagnosis Date   Bacterial vaginitis 07/01/2016   Di[place] mellitus [place] complication [place]   Medical history non-contributory     Past Surgical History: Past Surgical History:  Procedure Laterality Date   NO PAST SURGERIES      Obstetrical History: OB History     Gravida  3   Para  2   Term  2   Preterm  0   AB  0   Living  2      SAB  0   IAB  0   Ectopic  0   Multiple  0   Live Births  2           Social History: Social History   Socioeconomic History   Marital status: Single    Spouse name: Not on file   Number of children: Not on file   Years of education: Not on file   Highest education level: Not on file  Occupational History   Not on file  Tobacco Use   Smoking status: Never   Smokeless tobacco: Never  Vaping Use   Vaping Use: Never used  Substance and Sexual Activity   Alcohol use: Not Currently    Comment: not while preg   Drug use: No   Sexual activity: Yes    Birth control/protection: None  Other Topics Concern   Not on file  Social History Narrative   Not on file   Social Determinants of Health   Financial Resource Strain: Not on file  Food Insecurity: No Food Insecurity (12/22/2021)   Hunger Vital Sign    Worried About Running Out of Food in the Last Year: Never true    Ran Out of Food in the Last Year:  Never true  Transportation Needs: Not on file  Physical Activity: Not on file  Stress: Not on file  Social Connections: Not on file    Family History: Family History  Problem Relation Age of Onset   Heart disease Father    Asthma Neg Hx    Birth defects Neg Hx    Cancer Neg Hx    Diabetes Neg Hx    Hypertension Neg Hx    Stroke Neg Hx     Allergies: No Known Allergies  Medications Prior to Admission  Medication Sig Dispense Refill Last Dose   aspirin 81 MG chewable tablet Chew 1 tablet (81 mg total) by mouth daily. 30 tablet 8 02/22/2022   Prenatal Vit-Fe Fumarate-FA (PRENATAL VITAMIN) 27-0.8 MG TABS Take 1 tablet by mouth daily. 30 tablet 11 02/22/2022   Accu-Chek Softclix Lancets lancets Use as instructed 100 each 12    Blood Glucose Monitoring Suppl (ACCU-CHEK GUIDE) w/Device KIT 1 each by Does not apply route 4 (four) times daily. 1 kit 0    Doxylamine-Pyridoxine (DICLEGIS) 10-10 MG TBEC Take 2 tablets by mouth at bedtime. If symptoms persist, add one tablet  in the morning and one in the afternoon (Patient not taking: Reported on 12/28/2021) 100 tablet 5    glucose blood (ACCU-CHEK GUIDE) test strip Use as instructed 100 each 12    ondansetron (ZOFRAN-ODT) 4 MG disintegrating tablet Take 1 tablet (4 mg total) by mouth every 6 (six) hours as needed for nausea. (Patient not taking: Reported on 01/11/2022) 20 tablet 0    pantoprazole (PROTONIX) 20 MG tablet Take 1 tablet (20 mg total) by mouth daily. 30 tablet 2     Review of Systems  Pertinent pos/neg as indicated in HPI  Blood pressure 138/84, pulse 93, temperature 98.4 F (36.9 C), temperature source Oral, resp. rate 20, height 5' 5" (1.651 m), weight 109.7 kg, last menstrual period 05/12/2021, SpO2 96 %, currently breastfeeding. General appearance: alert, cooperative, and mild distress, uncomfortable w/ uc's Lungs: clear to auscultation bilaterally Heart: regular rate and rhythm Abdomen: gravid, soft,  non-tender Extremities: Tr edema DTR's 2+  Fetal monitoring: FHR: 135 bpm, variability: moderate,  Accelerations: Abscent,  decelerations:  Present  earlies Uterine activity: q 4-5 mins Dilation: 1.5 Effacement (%): 50 Station: -2 Exam by:: M.shropshire<RN Presentation: cephalic   Prenatal labs: ABO, Rh: B+ Antibody: neg Rubella: 4.44 (06/27 1044) RPR: Non Reactive (10/17 0857)  HBsAg: Negative (06/27 1044)  HIV: Non Reactive (10/17 0857)  GBS: Positive/-- (12/12 0900)  2hr GTT: abnormal (92/152/142)  Results for orders placed or performed during the hospital encounter of 02/23/22 (from the past 24 hour(s))  Type and screen [place]   Collection Time: 02/23/22  6:40 AM  Result Value Ref Range   ABO/RH(D) PENDING    Antibody Screen PENDING    Sample Expiration      02/26/2022,2359 Performed at [facility], [place] [address], [place], [place] [address]   Maryann Alar Test   Collection Time: 02/23/22  6:47 AM  Result Value Ref Range   POCT Fern Test Positive = ruptured amniotic membanes      Assessment:  15w5dSIUP  G3P2002  SROM, early labor  Cat 1 FHR  A1DM, EFW 40% @ 36w  [place] trait  2 copies SMN1  GBS Positive/-- (12/12 0900)  H/O GHTN- borderline bp's  Plan:  Admit to L&D  IV pain meds/epidural prn active labor  Expectant management for now  PCN for GBS+  Monitor bp's- get labs if elevated  CBGs per protocol  Anticipate NSVB   Plans to breastfeed  Contraception: depo  Circumcision: yes  KRoma SchanzCNM, WHNP-BC 02/23/2022, 7:04 AM

## 2022-02-23 NOTE — MAU Note (Signed)
.  Kristen [name] is a 31 y.o. at [redacted]w[redacted]d here in MAU reporting: SROM at 0530 - clear fluid. Ctx every 5 minutes since water broke. Denies VB. +FM   Onset of complaint: 0530 Pain score: 8   FHT:133 Lab orders placed from triage:  labor eval

## 2022-02-23 NOTE — Progress Notes (Signed)
Notified CNM (D. Simpson) about borderline High BP's upon admission to [facility].  Pt asymptomatic.   Continuing to monitor, first four hour check (VS and Fundus/bleeding) due at 1830 today.

## 2022-02-23 NOTE — Anesthesia Postprocedure Evaluation (Signed)
Anesthesia Post Note  Patient: Kristen [name]  Procedure(s) Performed: AN AD HOC LABOR EPIDURAL     Patient location during evaluation: Mother Baby Anesthesia Type: Epidural Level of consciousness: awake and alert Pain management: pain level controlled Vital Signs Assessment: post-procedure vital signs reviewed and stable Respiratory status: spontaneous breathing, nonlabored ventilation and respiratory function stable Cardiovascular status: stable Postop Assessment: no headache, no backache and epidural receding Anesthetic complications: no  No notable events documented.  Last Vitals:  Vitals:   02/23/22 1832 02/23/22 1925  BP: 123/86 134/81  Pulse: 99 80  Resp: 20   Temp: 36.6 C   SpO2: 100%     Last Pain:  Vitals:   02/23/22 1832  TempSrc: Oral  PainSc: 3    Pain Goal:                   Trellis Paganini

## 2022-02-23 NOTE — Anesthesia Procedure Notes (Signed)
Epidural Patient location during procedure: OB Start time: 02/23/2022 8:08 AM End time: 02/23/2022 8:21 AM  Staffing Anesthesiologist: Lowella Curb, MD Performed: anesthesiologist   Preanesthetic Checklist Completed: patient identified, IV checked, site marked, risks and benefits discussed, surgical consent, monitors and equipment checked, pre-op evaluation and timeout performed  Epidural Patient position: sitting Prep: ChloraPrep Patient monitoring: heart rate, cardiac monitor, continuous pulse ox and blood pressure Approach: midline Location: L2-L3 Injection technique: LOR saline  Needle:  Needle type: Tuohy  Needle gauge: 17 G Needle length: 9 cm Needle insertion depth: 9 cm Catheter type: closed end flexible Catheter size: 20 Guage Catheter at skin depth: 14 cm Test dose: negative  Assessment Events: blood not aspirated, injection not painful, no injection resistance, no paresthesia and negative IV test  Additional Notes Reason for block:procedure for pain

## 2022-02-23 NOTE — Anesthesia Preprocedure Evaluation (Signed)
Anesthesia Evaluation  Patient identified by MRN, date of birth, ID band Patient awake    Reviewed: Allergy & Precautions, NPO status , Patient's Chart, lab work & pertinent test results  Airway Mallampati: II  TM Distance: >3 FB Neck ROM: Full    Dental no notable dental hx. (+) Teeth Intact, Dental Advisory Given   Pulmonary neg pulmonary ROS   Pulmonary exam normal breath sounds clear to auscultation       Cardiovascular Exercise Tolerance: Good negative cardio ROS Normal cardiovascular exam Rhythm:Regular Rate:Normal     Neuro/Psych negative neurological ROS  negative psych ROS   GI/Hepatic negative GI ROS, Neg liver ROS,,,  Endo/Other  negative endocrine ROSdiabetes    Renal/GU negative Renal ROS     Musculoskeletal   Abdominal  (+) + obese  Peds  Hematology  (+) Blood dyscrasia, Sickle cell trait Lab Results      Component                Value               Date                      WBC                      11.0 (H)            10/15/2019                HGB                      10.4 (L)            10/15/2019                HCT                      33.9 (L)            10/15/2019                MCV                      78.8 (L)            10/15/2019                PLT                      293                 10/15/2019              Anesthesia Other Findings   Reproductive/Obstetrics (+) Pregnancy                              Anesthesia Physical Anesthesia Plan  ASA: III  Anesthesia Plan: Epidural   Post-op Pain Management:    Induction:   PONV Risk Score and Plan:   Airway Management Planned: Natural Airway  Additional Equipment:   Intra-op Plan:   Post-operative Plan:   Informed Consent: I have reviewed the patients History and Physical, chart, labs and discussed the procedure including the risks, benefits and alternatives for the proposed anesthesia with the  patient or authorized representative who has indicated his/her understanding and acceptance.       Plan Discussed with: CRNA  Anesthesia Plan Comments:          Anesthesia Quick Evaluation

## 2022-02-24 ENCOUNTER — Encounter (HOSPITAL_COMMUNITY): Payer: Self-pay | Admitting: Obstetrics and Gynecology

## 2022-02-24 LAB — COMPREHENSIVE METABOLIC PANEL
ALT: 13 U/L (ref 0–44)
AST: 22 U/L (ref 15–41)
Albumin: 2.3 g/dL — ABNORMAL LOW (ref 3.5–5.0)
Alkaline Phosphatase: 94 U/L (ref 38–126)
Anion gap: 7 (ref 5–15)
BUN: <5 mg/dL — ABNORMAL LOW (ref 6–20)
CO2: 20 mmol/L — ABNORMAL LOW (ref 22–32)
Calcium: 8.5 mg/dL — ABNORMAL LOW (ref 8.9–10.3)
Chloride: 109 mmol/L (ref 98–111)
Creatinine, Ser: 0.79 mg/dL (ref 0.44–1.00)
GFR, Estimated: >60 mL/min (ref >60)
Glucose, Bld: 103 mg/dL — ABNORMAL HIGH (ref 70–99)
Potassium: 3.6 mmol/L (ref 3.5–5.1)
Sodium: 136 mmol/L (ref 135–145)
Total Bilirubin: 0.3 mg/dL (ref 0.3–1.2)
Total Protein: 5.6 g/dL — ABNORMAL LOW (ref 6.5–8.1)

## 2022-02-24 MED ORDER — FUROSEMIDE 20 MG PO TABS
20.0000 mg | ORAL_TABLET | Freq: Every day | ORAL | Status: DC
  Administered 2022-02-24 – 2022-02-25 (×2): 20 mg via ORAL
  Filled 2022-02-24 (×2): qty 1

## 2022-02-24 MED ORDER — NIFEDIPINE ER OSMOTIC RELEASE 30 MG PO TB24
30.0000 mg | ORAL_TABLET | Freq: Every day | ORAL | Status: DC
  Administered 2022-02-24 – 2022-02-25 (×2): 30 mg via ORAL
  Filled 2022-02-24 (×2): qty 1

## 2022-02-24 MED ORDER — MENTHOL 3 MG MT LOZG
1.0000 | LOZENGE | OROMUCOSAL | Status: DC | PRN
  Filled 2022-02-24: qty 9

## 2022-02-24 MED ORDER — FUROSEMIDE 20 MG PO TABS: 20.0000 mg | ORAL_TABLET | Freq: Every day | ORAL | Status: DC

## 2022-02-24 MED ORDER — DM-GUAIFENESIN ER 30-600 MG PO TB12
1.0000 | ORAL_TABLET | Freq: Two times a day (BID) | ORAL | Status: DC
  Administered 2022-02-24 – 2022-02-25 (×3): 1 via ORAL
  Filled 2022-02-24 (×4): qty 1

## 2022-02-24 NOTE — Progress Notes (Signed)
POSTPARTUM PROGRESS NOTE  Post Partum Day 1  Subjective:  Kristen [name] is a 31 y.o. G3P3003 s/p VD at [redacted]w[redacted]d.  She reports she is doing well. No acute events overnight. She denies any problems with ambulating, voiding or po intake. Denies nausea or vomiting.  Pain is well controlled.  Lochia is adequate.  Objective: Blood pressure 132/85, pulse 93, temperature 98.1 F (36.7 C), temperature source Oral, resp. rate 17, height 5\' 5"  (1.651 m), weight 109.7 kg, last menstrual period 05/12/2021, SpO2 100 %, unknown if currently breastfeeding.  Physical Exam:  General: alert, cooperative and no distress Chest: no respiratory distress Heart:regular rate, distal pulses intact Uterine Fundus: firm, appropriately tender DVT Evaluation: No calf swelling or tenderness Extremities: no edema Skin: warm, dry  Recent Labs    02/23/22 0640  HGB 11.3*  HCT 34.4*    Assessment/Plan: Shantai Tiedeman is a 31 y.o. G3P3003 s/p VD at [redacted]w[redacted]d   PPD#1 - Doing well  Routine postpartum care A1DM- CBG 103 Pp HTN- BP elevated, started procardia 30 XL and lasix. CMP this AM largely unremarkable. Contraception: depo Feeding: breast Dispo: Plan for discharge in the next 24-48h.   LOS: 1 day   [redacted]w[redacted]d, DO OB Fellow  02/24/2022, 8:05 AM

## 2022-02-24 NOTE — Progress Notes (Signed)
Called MD due to concerning from pt stating she was having a sore throat, green nose fluid and cough up some green fluid as well. MD prescribed Mucinex DM.

## 2022-02-24 NOTE — Progress Notes (Signed)
Circumcision Consent   -Circumcision procedure details discussed including usage of local anesthesia, infant soother, and removal and disposal of foreskin. -Risks and benefits of procedure were reviewed including, but not limited to:  *Benefits include reduction in the rates of urinary tract infection (UTI), penile cancer, some sexually transmitted infections, penile inflammatory, and retractile disorders, as well as easier hygiene.   *Risks include bleeding, infection, injury of glans which may lead to need for additional surgery, penile deformity, or urinary tract issues, unsatisfactory cosmetic appearance and other potential complications related to the procedure.   -Informed that procedure will not be performed if provider deems inappropriate d/t penile size, noted deformity, or unsatisfactory pediatric evaluation. -It was emphasized that this is an elective procedure.   -Post circumcision care discussed.   Patient wants to proceed with circumcision. Informed that team would be notified and complete prior to discharge and upon satisfactory pediatric evaluation of infant.    Kristen Jasmin Q Mercado-Ortiz, DO FMOB Fellow, Faculty practice [place], [facility] 9:19 AM     

## 2022-02-25 ENCOUNTER — Other Ambulatory Visit (HOSPITAL_COMMUNITY): Payer: Self-pay

## 2022-02-25 MED ORDER — DM-GUAIFENESIN ER 30-600 MG PO TB12
1.0000 | ORAL_TABLET | Freq: Two times a day (BID) | ORAL | 0 refills | Status: AC
  Filled 2022-02-25: qty 10, 5d supply, fill #0

## 2022-02-25 MED ORDER — SENNOSIDES-DOCUSATE SODIUM 8.6-50 MG PO TABS
2.0000 | ORAL_TABLET | Freq: Every day | ORAL | 0 refills | Status: AC
  Filled 2022-02-25: qty 30, 15d supply, fill #0

## 2022-02-25 MED ORDER — IBUPROFEN 600 MG PO TABS
600.0000 mg | ORAL_TABLET | Freq: Four times a day (QID) | ORAL | 0 refills | Status: AC
  Filled 2022-02-25: qty 30, 8d supply, fill #0

## 2022-02-25 MED ORDER — ACETAMINOPHEN 325 MG PO TABS
650.0000 mg | ORAL_TABLET | ORAL | 0 refills | Status: AC | PRN
  Filled 2022-02-25: qty 30, 3d supply, fill #0

## 2022-02-25 MED ORDER — BENZOCAINE-MENTHOL 20-0.5 % EX AERO
1.0000 | INHALATION_SPRAY | CUTANEOUS | 0 refills | Status: AC | PRN
  Filled 2022-02-25: qty 78, fill #0

## 2022-02-25 MED ORDER — FUROSEMIDE 20 MG PO TABS
20.0000 mg | ORAL_TABLET | Freq: Every day | ORAL | 0 refills | Status: AC
  Filled 2022-02-25: qty 3, 3d supply, fill #0

## 2022-02-25 MED ORDER — NIFEDIPINE ER 30 MG PO TB24
30.0000 mg | ORAL_TABLET | Freq: Every day | ORAL | 1 refills | Status: AC
  Filled 2022-02-25: qty 30, 30d supply, fill #0

## 2022-03-03 ENCOUNTER — Ambulatory Visit (INDEPENDENT_AMBULATORY_CARE_PROVIDER_SITE_OTHER): Payer: Medicaid Other

## 2022-03-03 VITALS — BP 148/71 | HR 52 | Wt 224.0 lb

## 2022-03-03 DIAGNOSIS — Z0130 Encounter for examination of blood pressure without abnormal findings: Secondary | ICD-10-CM

## 2022-03-03 NOTE — Progress Notes (Signed)
Subjective:  Kristen [name] is a 32 y.o. female here for BP check.    Objective:  BP (!) 148/71   Pulse (!) 52   Wt 224 lb (101.6 kg)   LMP 05/12/2021 (Exact Date)   Breastfeeding Yes   BMI 37.28 kg/m   Appearance alert, well appearing, and in no distress. General exam BP noted to be well controlled today in office.    Assessment:   Blood Pressure control uncertain.   Plan:  Patient did not take BP medication before appointment. Patient will take f/u in 1 week and take medication the morning of her appointment.   Kristen [name], CMA

## 2022-03-10 ENCOUNTER — Ambulatory Visit: Payer: Medicaid Other

## 2022-03-29 ENCOUNTER — Ambulatory Visit: Payer: Medicaid Other | Admitting: Advanced Practice Midwife

## 2022-04-05 ENCOUNTER — Other Ambulatory Visit (HOSPITAL_BASED_OUTPATIENT_CLINIC_OR_DEPARTMENT_OTHER): Payer: Self-pay

## 2022-04-05 ENCOUNTER — Ambulatory Visit (INDEPENDENT_AMBULATORY_CARE_PROVIDER_SITE_OTHER): Payer: Medicaid Other

## 2022-04-05 DIAGNOSIS — Z3042 Encounter for surveillance of injectable contraceptive: Secondary | ICD-10-CM

## 2022-04-05 DIAGNOSIS — Z8632 Personal history of gestational diabetes: Secondary | ICD-10-CM

## 2022-04-05 DIAGNOSIS — Z30013 Encounter for initial prescription of injectable contraceptive: Secondary | ICD-10-CM

## 2022-04-05 DIAGNOSIS — O24410 Gestational diabetes mellitus in pregnancy, diet controlled: Secondary | ICD-10-CM

## 2022-04-05 DIAGNOSIS — Z3202 Encounter for pregnancy test, result negative: Secondary | ICD-10-CM | POA: Diagnosis not present

## 2022-04-05 LAB — POCT URINE PREGNANCY: Preg Test, Ur: NEGATIVE

## 2022-04-05 MED ORDER — MEDROXYPROGESTERONE ACETATE 150 MG/ML IM SUSP
150.0000 mg | Freq: Once | INTRAMUSCULAR | Status: AC
  Administered 2022-04-05: 150 mg via INTRAMUSCULAR

## 2022-04-05 MED ORDER — MEDROXYPROGESTERONE ACETATE 150 MG/ML IM SUSY
150.0000 mg | PREFILLED_SYRINGE | INTRAMUSCULAR | 3 refills | Status: AC
  Filled 2022-04-05 (×2): qty 1, 84d supply, fill #0
  Filled 2022-06-28: qty 1, 84d supply, fill #1
  Filled 2022-09-11: qty 1, 84d supply, fill #2
  Filled 2022-12-05: qty 1, 84d supply, fill #3

## 2022-04-05 NOTE — Progress Notes (Signed)
Post Partum Visit Note  Kristen [name] is a 32 y.o. [phone] female who presents for a postpartum visit. She is 6 weeks postpartum following a normal spontaneous vaginal delivery.  I have fully reviewed the prenatal and intrapartum course. The delivery was at 38.5 gestational weeks.  Anesthesia: epidural. Postpartum course has been uncomplicated. Baby is doing well. Baby is feeding by breast. Bleeding no bleeding. Bowel function is normal. Bladder function is normal. Patient is not sexually active. Contraception method is Depo-Provera injections. Postpartum depression screening: negative.  Patient endorses safety at home and denies DVA. She states she receives assistance with infant care from the FOB.  She reports breast feeding is going well and denies breast issues.  She denies depression and endorses receiving at least 4 hours of sleep daily.   The pregnancy intention screening data noted above was reviewed. Potential methods of contraception were discussed. The patient elected to proceed with No data recorded.    Health Maintenance Due  Topic Date Due   COVID-19 Vaccine (1) Never done    The following portions of the patient's history were reviewed and updated as appropriate: allergies, current medications, past family history, past medical history, past social history, past surgical history, and problem list.  Review of Systems Pertinent items noted in HPI and remainder of comprehensive ROS otherwise negative.  Objective:  LMP 05/12/2021    General:  alert, cooperative, and no distress   Breasts:  normal  Lungs: clear to auscultation bilaterally  Heart:  regular rate and rhythm  Abdomen: normal findings: no organomegaly   Wound None  GU exam:  normal       Assessment:    1. Postpartum care and examination -Doing well. -Discussed usage of lubrication once initiating sexual activity.   2. Gestational diabetes mellitus, class A1 -GTT completed today. -Informed if  abnormal would need to follow up with PCP. -Information for PCP to be placed in AVS.   3. Initiation of Depo Provera -Initial injection given today. -Patient to follow up accordingly.     Plan:   Essential components of care per ACOG recommendations:  1.  Mood and well being: Patient with negative depression screening today. Reviewed local resources for support.  - Patient tobacco use? No.   - hx of drug use? No.    2. Infant care and feeding:  -Patient currently breastmilk feeding? Yes. Reviewed importance of draining breast regularly to support lactation.  -Social determinants of health (SDOH) reviewed in EPIC. No concerns  3. Sexuality, contraception and birth spacing - Patient does not want a pregnancy in the next year.  Desired family size is 4 children.  - Reviewed reproductive life planning. Reviewed contraceptive methods based on pt preferences and effectiveness.  Patient desired Hormonal Injection today.   - Discussed birth spacing of 18 months  4. Sleep and fatigue -Encouraged family/partner/community support of 4 hrs of uninterrupted sleep to help with mood and fatigue  5. Physical Recovery  - Discussed patients delivery and complications. She describes her labor as good. - Patient had a Vaginal, no problems at delivery. Patient had a  no  laceration. Perineal healing reviewed. Patient expressed understanding - Patient has urinary incontinence? No. - Patient is safe to resume physical and sexual activity  6.  Health Maintenance - HM due items addressed Yes - Last pap smear  Diagnosis  Date Value Ref Range Status  08/24/2021   Final   - Negative for intraepithelial lesion or malignancy (NILM)  Pap smear not done at today's visit.  -Breast Cancer screening indicated? No.   7. Chronic Disease/Pregnancy Condition follow up: Gestational Diabetes -GTT completed today. -Will await results.   Maryann Conners, [place] for [employer], [place]

## 2022-04-06 LAB — GLUCOSE TOLERANCE, 2 HOURS
Glucose, 2 hour: 93 mg/dL (ref 70–139)
Glucose, GTT - Fasting: 86 mg/dL (ref 70–99)

## 2022-04-06 NOTE — Patient Instructions (Signed)
[place] ([place] [address] [place], [place] [address] [phone] Mon-Fri 8:30-12:30, 1:30-5:00 Accepting [facility] at [place], [place], [place] [address] [phone] Mon-Fri 8:00-5:30 [place] [facility] [address] [place], [place] [address] [phone] Mechele Dawley, Thur, Fri 8:30-5:00, Wed 10:00-7:00 (closed 1-2pm) Accepting [facility] [place]. [address], [place], [place]  [address] Phone - [phone]   Fax - [phone]  [place] [phone]) [facility] [address], [place], [place] [address] [phone] Mon-Fri 8:00-5:00 [facility] at [facility])  [address] Barbara Cower [place], [place] [address] [phone] Mon-Fri 8:30-5:30, Sat (Oct.-Mar.) 9:00-1:00 Accepting Medicaid  [place] [phone]) [place] at [place], [place], [place] [address] [phone] Mon-Fri 8:00-5:00  [place] [phone]) [place] at [facility] [address] [place], [place] [address] [phone] Mon-Fri 8:00-5:00 [place] at [place], [place], [place] [address] [phone] Mon-Fri 8:00-5:00 [place] at [facility] [place] [address] Madelaine Bhat [place], [place] [address] [phone] Mon-Fri 8:00-5:00 [place]., [place] [address] [phone] Mon-Fri 7:30-5:30  [place] [phone] & [phone]) [facility] [address], [place], [place] [address] [phone] Mon-Thur 8:00-6:00 Accepting Medicaid [place] [address] Madelaine Bhat [place], [place] [address] [phone] Mon-Thur 7:30-7:30, Fri 7:30-4:30 Accepting  [facility] at [place]. [address], [place], [place]  [address] [phone]   Fax - [place] [phone] & [phone]) [place] at [place] [place], [place] [address] [phone] Mon-Fri 7:00-5:00 [place] [address], [place], [place] [address] [phone] Mon-Fri 8:00-5:00 Accepting Medicaid [place] [address] [place], [place], [place] [address] [phone] Mon-Fri 8:00-5:00 Accepting Medicaid  [place] [phone]) [facility] at [facility] [address] Madelaine Bhat [place], [place] [address] [phone] Mon-Fri 8:00-5:00 [place] ([place] at [facility]) [address] [place], [place], [place] [address] [phone] Mon-Fri 8:00-5:00 Accepting Medicaid [place] ([facility] [employer]) [address] [place], [place], [place] [address] [phone] Mon-Fri 8:00-5:30, Sat&Sun by appointment (phones open at 8:30) Accepting [facility] [phone] & [phone]) [place] [address], [place], [place] [address] [phone] Mon-Thur 8:00-7:00, Fri 8:00-5:00, Sat 8:00-12:00, Sun 9:00-12:00 Accepting Medicaid [facility] at [facility] [address] [place], [place], [place] [address] [phone] Mon-Thur 8:00-5:00 Accepting Medicaid [facility] at [place]., [place], [place] [address] [phone] Mon-Fri 8:00-5:30, Sat (Oct.-Mar.) 9:00-1:00 Accepting [facility]  [place] [phone]) [address] [place], [place], [place] [address] [phone] Mon-Fri 8:00-5:00 Accepting [facility]   [place] [phone]) [place] at [facility] [address], [place], [place] [address] [phone] Mon-Fri 8:00-5:00 [place] at [facility] [address] Marolyn Hammock [place], [place] [address] [phone] Mon-Fri 8:00-5:00 [place], [place], [place] [address] [phone] Mon-Fri 8:00-5:00 After hours clinic [facility] [place],  [address]) [phone] Mon-Fri 5:00-8:00, Sat 12:00-6:00, Sun 10:00-4:00 Accepting Medicaid [place] at [facility]  [address], [place], [place]  [address] [phone]   Fax - [phone]  [place] [phone]) [place] at [facility] [address] [place], [place], [place] [address] [phone] Mon-Fri 8:00-5:00 [place] ([place] at [place]) [address] [place], [place], [place] [address] [phone] Mon-Thur 8:00-7:00, Fri 8:00-5:00, Sat 8:00-12:00

## 2022-06-28 ENCOUNTER — Ambulatory Visit (INDEPENDENT_AMBULATORY_CARE_PROVIDER_SITE_OTHER): Payer: Medicaid Other

## 2022-06-28 ENCOUNTER — Other Ambulatory Visit (HOSPITAL_BASED_OUTPATIENT_CLINIC_OR_DEPARTMENT_OTHER): Payer: Self-pay

## 2022-06-28 VITALS — BP 134/85 | HR 97 | Wt 229.0 lb

## 2022-06-28 DIAGNOSIS — Z3042 Encounter for surveillance of injectable contraceptive: Secondary | ICD-10-CM

## 2022-06-28 MED ORDER — MEDROXYPROGESTERONE ACETATE 150 MG/ML IM SUSY
150.0000 mg | PREFILLED_SYRINGE | Freq: Once | INTRAMUSCULAR | Status: AC
  Administered 2022-06-28: 150 mg via INTRAMUSCULAR

## 2022-06-28 NOTE — Progress Notes (Signed)
Kristen [name] here for Depo-Provera  Injection.  Injection administered without complication. Patient will return in 3 months for next injection.  Saahir Prude l Jozelynn Danielson, CMA 06/28/2022  11:26 AM

## 2022-09-12 ENCOUNTER — Other Ambulatory Visit: Payer: Self-pay

## 2022-09-13 ENCOUNTER — Ambulatory Visit (INDEPENDENT_AMBULATORY_CARE_PROVIDER_SITE_OTHER): Payer: Medicaid Other

## 2022-09-13 VITALS — BP 125/77 | HR 97 | Ht 65.0 in | Wt 233.0 lb

## 2022-09-13 DIAGNOSIS — Z3042 Encounter for surveillance of injectable contraceptive: Secondary | ICD-10-CM | POA: Diagnosis not present

## 2022-09-13 MED ORDER — MEDROXYPROGESTERONE ACETATE 150 MG/ML IM SUSY
150.0000 mg | PREFILLED_SYRINGE | Freq: Once | INTRAMUSCULAR | Status: AC
  Administered 2022-09-13: 150 mg via INTRAMUSCULAR

## 2022-09-13 NOTE — Progress Notes (Signed)
Date last pap: 08-24-21. Last Depo-Provera: 06-28-22. Side Effects if any: none. Serum HCG indicated? NA. Depo-Provera 150 mg IM given by: Burnard Leigh Rn. Next appointment due 3 months- to be scheduled.

## 2022-12-05 ENCOUNTER — Ambulatory Visit: Payer: Medicaid Other

## 2022-12-05 ENCOUNTER — Other Ambulatory Visit (HOSPITAL_COMMUNITY)
Admission: RE | Admit: 2022-12-05 | Discharge: 2022-12-05 | Disposition: A | Discharge status: Home / Self Care | Payer: Medicaid Other | Source: Ambulatory Visit | Attending: Obstetrics and Gynecology | Admitting: Obstetrics and Gynecology

## 2022-12-05 ENCOUNTER — Other Ambulatory Visit (HOSPITAL_BASED_OUTPATIENT_CLINIC_OR_DEPARTMENT_OTHER): Payer: Self-pay

## 2022-12-05 VITALS — BP 129/88 | HR 86 | Wt 237.0 lb

## 2022-12-05 DIAGNOSIS — N898 Other specified noninflammatory disorders of vagina: Secondary | ICD-10-CM | POA: Insufficient documentation

## 2022-12-05 DIAGNOSIS — Z3042 Encounter for surveillance of injectable contraceptive: Secondary | ICD-10-CM

## 2022-12-05 MED ORDER — MEDROXYPROGESTERONE ACETATE 150 MG/ML IM SUSY
150.0000 mg | PREFILLED_SYRINGE | Freq: Once | INTRAMUSCULAR | Status: AC
  Administered 2022-12-05: 150 mg via INTRAMUSCULAR

## 2022-12-05 NOTE — Progress Notes (Signed)
Kristen [name] here for Depo-Provera  Injection.  Injection administered without complication. Patient will return in 3 months for next injection.   Patient also requests STI testing. Patient reports having pink discharge x 1 week. Self swab was sent to the lab.  Kristen [name], CMA 12/05/2022  11:10 AM

## 2022-12-06 LAB — CERVICOVAGINAL ANCILLARY ONLY
Bacterial Vaginitis (gardnerella): NEGATIVE
Candida Glabrata: NEGATIVE
Candida Vaginitis: NEGATIVE
Chlamydia: NEGATIVE
Comment: NEGATIVE
Comment: NEGATIVE
Comment: NEGATIVE
Comment: NEGATIVE
Comment: NEGATIVE
Comment: NORMAL
Neisseria Gonorrhea: NEGATIVE
Trichomonas: NEGATIVE

## 2022-12-07 ENCOUNTER — Telehealth: Payer: Self-pay

## 2022-12-07 NOTE — Telephone Encounter (Signed)
I connected with  Kristen [name] on 12/07/22 by telephone and verified that I am speaking with the correct person using two identifiers.   Patient informed of negative ancillary swab results.  Patient voices understanding and has no further questions.

## 2023-02-28 ENCOUNTER — Other Ambulatory Visit: Payer: Self-pay

## 2023-02-28 DIAGNOSIS — Z30013 Encounter for initial prescription of injectable contraceptive: Secondary | ICD-10-CM

## 2023-03-02 ENCOUNTER — Other Ambulatory Visit (HOSPITAL_BASED_OUTPATIENT_CLINIC_OR_DEPARTMENT_OTHER): Payer: Self-pay

## 2023-03-02 ENCOUNTER — Ambulatory Visit: Payer: Medicaid Other

## 2023-03-02 ENCOUNTER — Other Ambulatory Visit: Payer: Self-pay

## 2023-03-02 VITALS — BP 121/70 | HR 79 | Wt 244.0 lb

## 2023-03-02 DIAGNOSIS — Z3042 Encounter for surveillance of injectable contraceptive: Secondary | ICD-10-CM

## 2023-03-02 MED ORDER — MEDROXYPROGESTERONE ACETATE 150 MG/ML IM SUSY
150.0000 mg | PREFILLED_SYRINGE | Freq: Once | INTRAMUSCULAR | Status: AC
  Administered 2023-03-02: 150 mg via INTRAMUSCULAR

## 2023-03-02 MED ORDER — MEDROXYPROGESTERONE ACETATE 150 MG/ML IM SUSY
150.0000 mg | PREFILLED_SYRINGE | INTRAMUSCULAR | 3 refills | Status: AC
  Filled 2023-03-02: qty 1, 84d supply, fill #0
  Filled 2023-05-24: qty 1, 84d supply, fill #1
  Filled 2023-08-10: qty 1, 84d supply, fill #2
  Filled 2024-02-04: qty 1, 84d supply, fill #3

## 2023-03-02 NOTE — Progress Notes (Signed)
Rutherford Limerick here for Depo-Provera  Injection.  Injection administered without complication. Patient will return in 3 months for next injection.  Kaylen Nghiem l Jaculin Rasmus, CMA 03/02/2023  10:27 AM

## 2023-03-07 IMAGING — US US OB COMP LESS 14 WK
1 series · 15 of 28 positions shown · non-contrast
Comparison: None Available.

CLINICAL DATA: Spotting.

EXAM:
OBSTETRIC <14 WK ULTRASOUND
TECHNIQUE: Transabdominal ultrasound was performed for evaluation of the
gestation as well as the maternal uterus and adnexal regions.

[Series 1: us ob comp less 14 wk · 44 acquisitions, 15 frames shown]
[im 1/44]
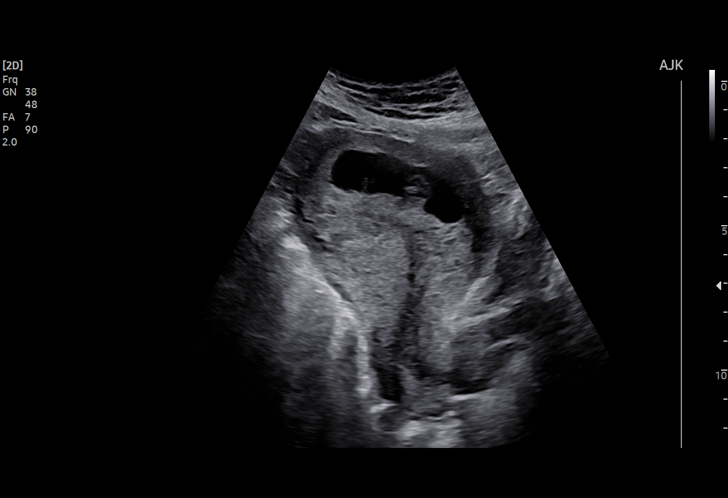
[im 4/44]
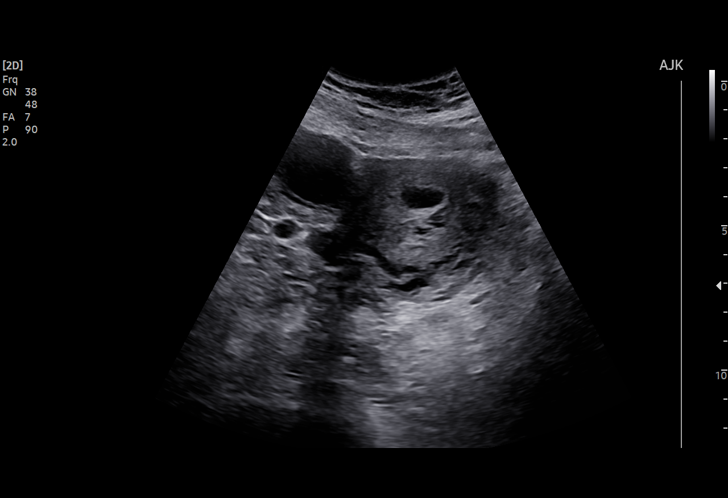
[im 7/44]
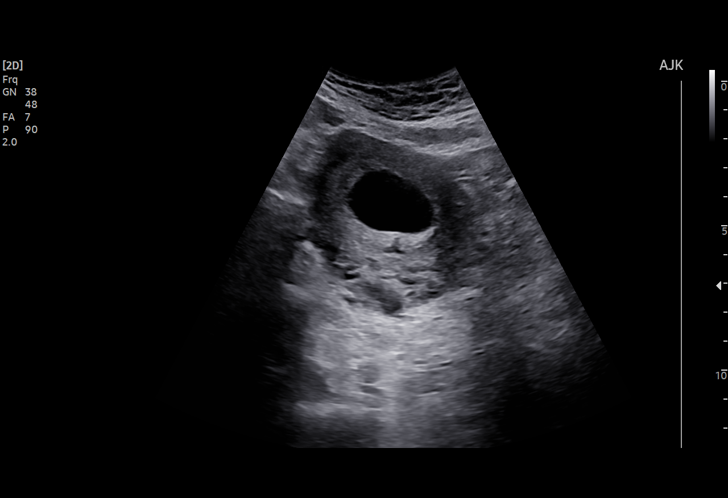
[im 10/44]
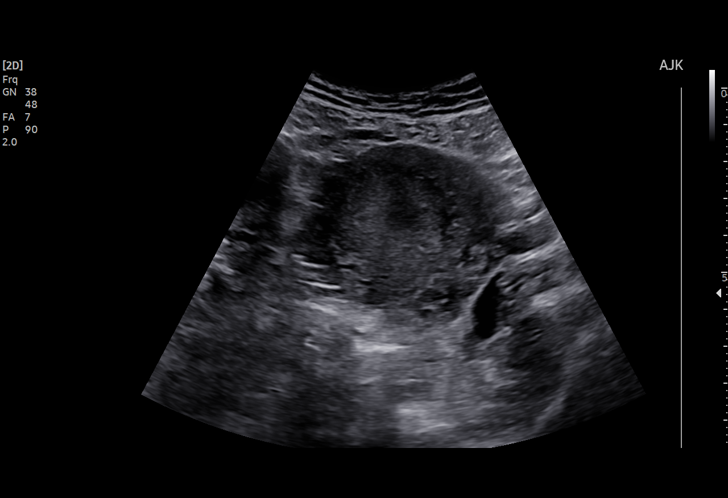
[im 13/44]
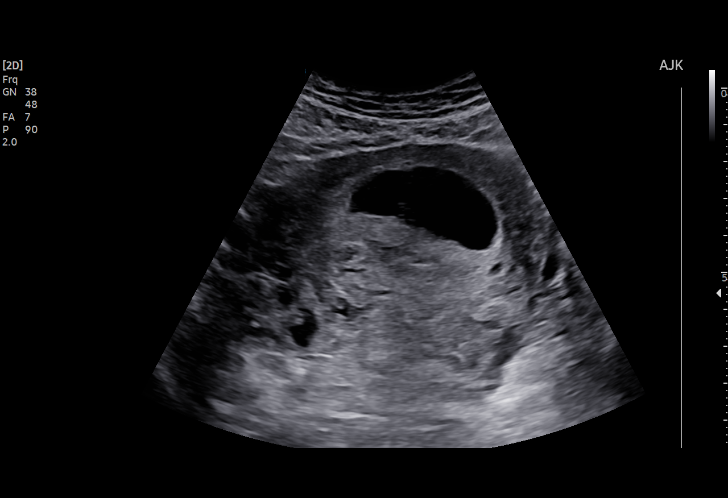
[im 16/44]
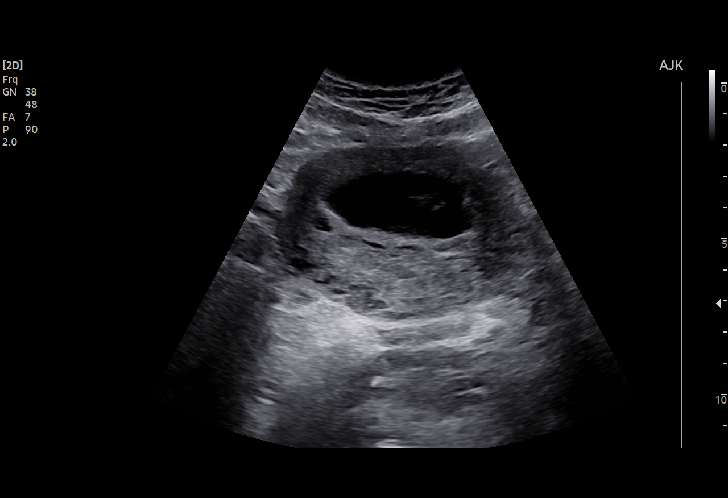
[im 20/44]
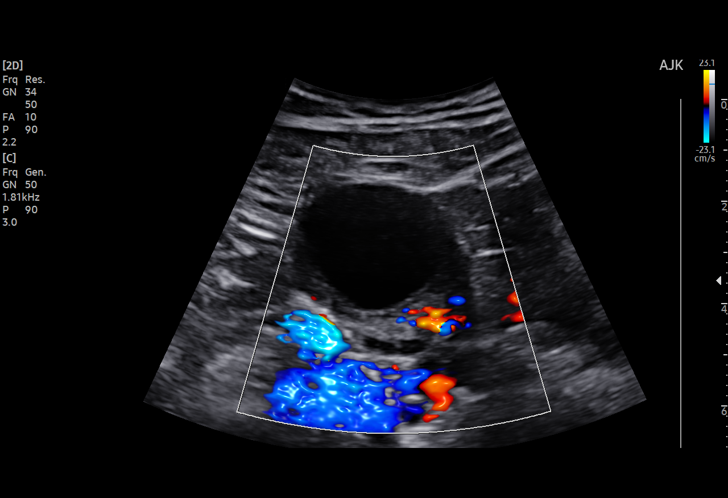
[im 23/44]
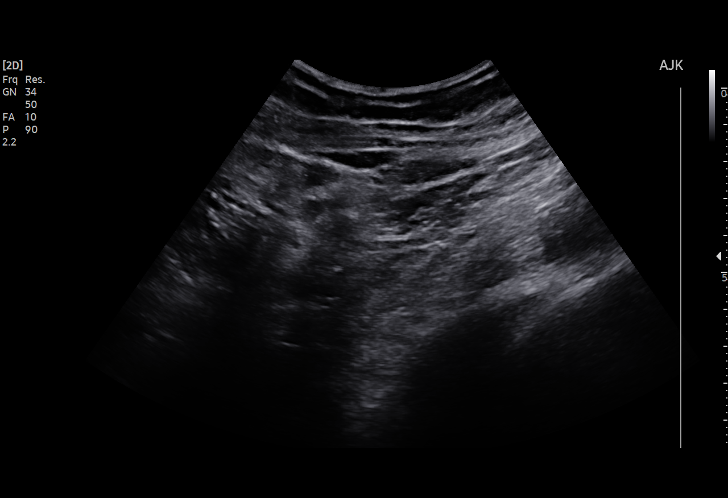
[im 24/44]
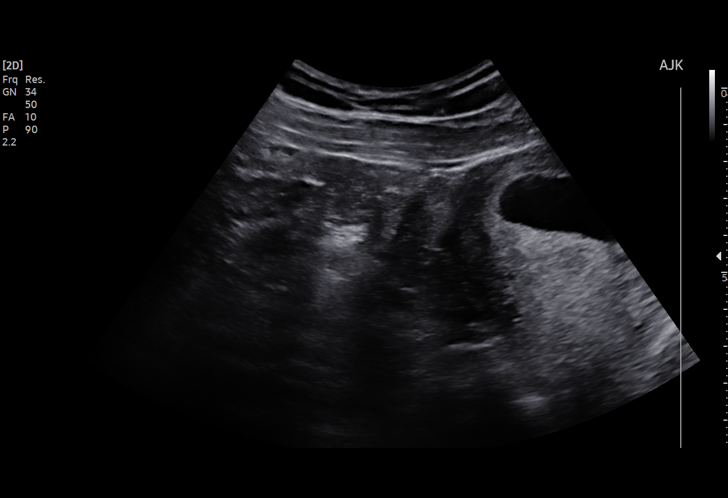
[im 28/44]
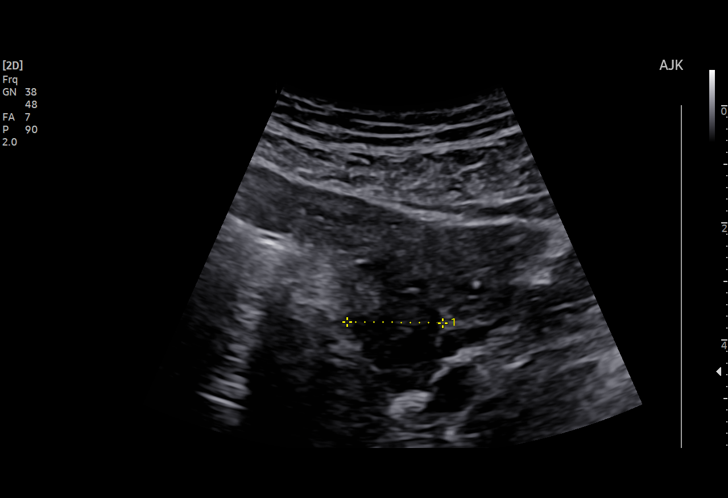
[im 31/44]
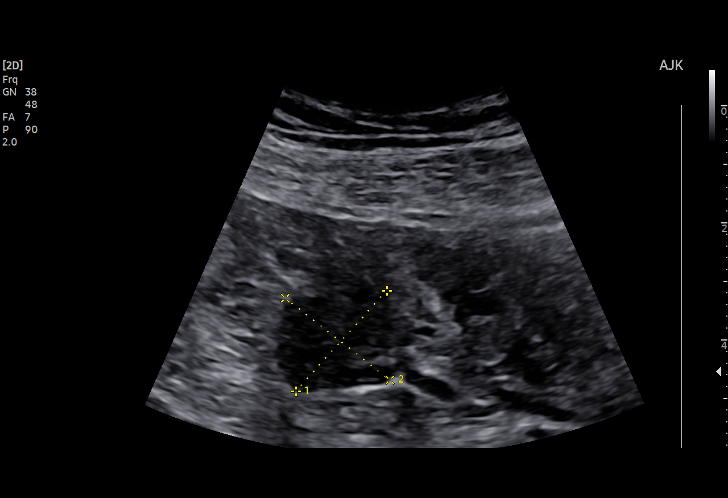
[im 34/44]
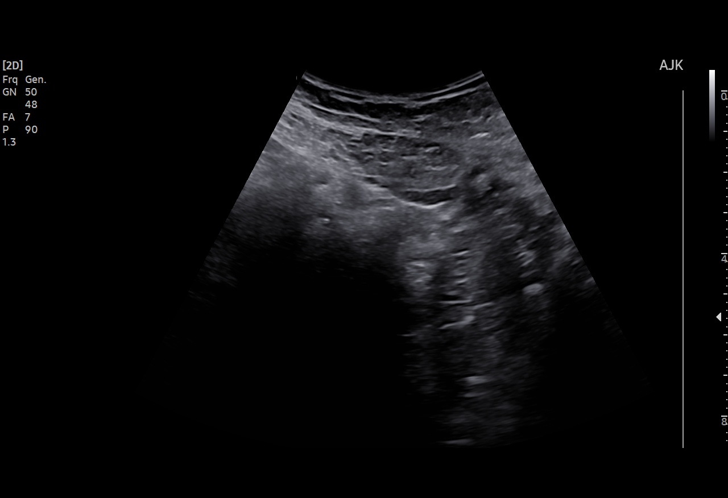
[im 37/44]
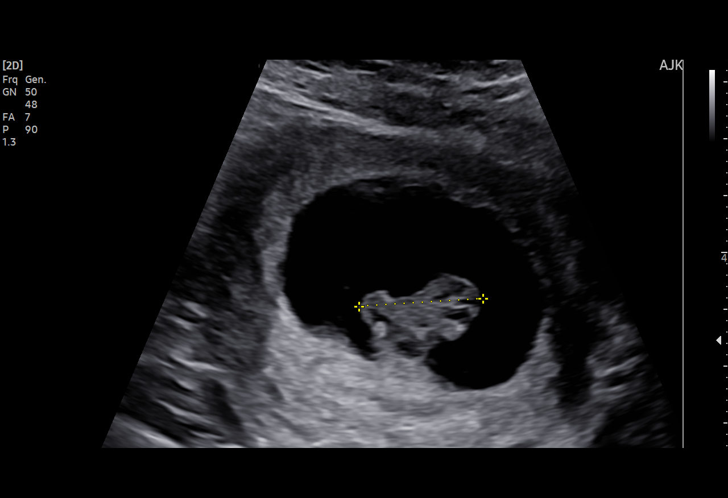
[im 40/44]
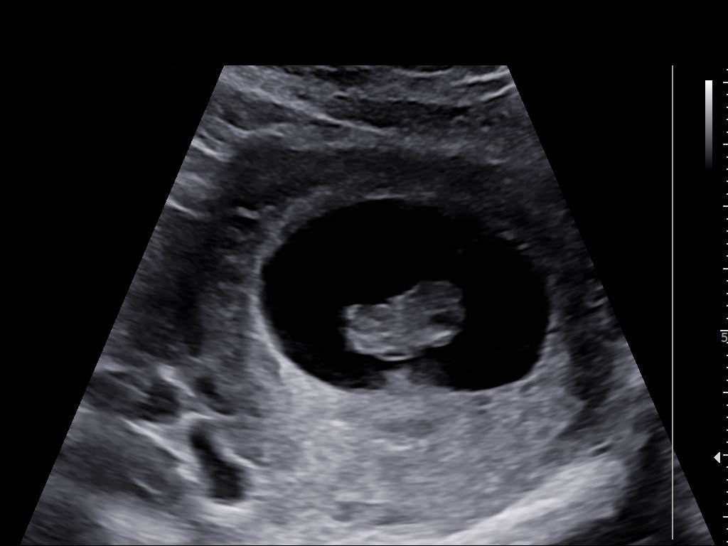
[im 44/44]
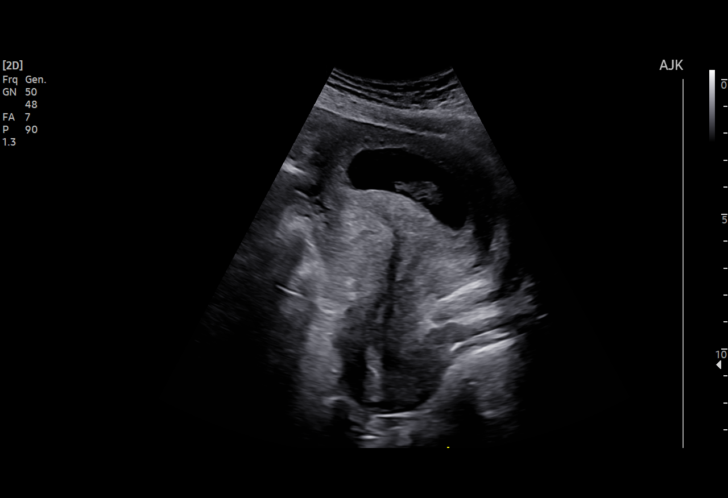

[15 of 28 positions shown; findings below may reference images not displayed]

FINDINGS: Intrauterine gestational sac: Single

Yolk sac:  Visualized.

Embryo:  Visualized.

Cardiac Activity: Visualized.

Heart Rate: 180 bpm

CRL:   21.6 mm   8 w 6 d                  US EDC: 03/04/2022

Subchorionic hemorrhage:  None visualized.

Maternal uterus/adnexae: Bilateral ovaries appear within normal
limits. Corpus luteum noted in the right ovary. No free fluid
identified.
IMPRESSION: 1. Single live intrauterine gestation measuring 8 weeks 6 days by
crown-rump length. No acute abnormality.

## 2023-05-25 ENCOUNTER — Other Ambulatory Visit: Payer: Self-pay

## 2023-05-26 ENCOUNTER — Ambulatory Visit: Payer: Medicaid Other

## 2023-05-26 VITALS — BP 126/65 | HR 80 | Wt 246.0 lb

## 2023-05-26 DIAGNOSIS — Z3042 Encounter for surveillance of injectable contraceptive: Secondary | ICD-10-CM

## 2023-05-26 MED ORDER — MEDROXYPROGESTERONE ACETATE 150 MG/ML IM SUSP
150.0000 mg | Freq: Once | INTRAMUSCULAR | Status: AC
  Administered 2023-05-26: 150 mg via INTRAMUSCULAR

## 2023-05-26 NOTE — Progress Notes (Signed)
 Date last pap: 08/24/21. Last Depo-Provera: 03/02/23. Side Effects if any: n/a. Serum HCG indicated? n/a. Depo-Provera 150 mg IM given by: Lorelle Gibbs, CMA. Next appointment due 08/14/23  Janetta Vandoren l Buffy Ehler, CMA .

## 2023-06-12 ENCOUNTER — Ambulatory Visit (HOSPITAL_COMMUNITY)
Admission: EM | Admit: 2023-06-12 | Discharge: 2023-06-12 | Disposition: A | Discharge status: Home / Self Care | Attending: Emergency Medicine | Admitting: Emergency Medicine

## 2023-06-12 ENCOUNTER — Encounter (HOSPITAL_COMMUNITY): Payer: Self-pay | Admitting: Emergency Medicine

## 2023-06-12 ENCOUNTER — Other Ambulatory Visit: Payer: Self-pay

## 2023-06-12 DIAGNOSIS — H01004 Unspecified blepharitis left upper eyelid: Secondary | ICD-10-CM | POA: Diagnosis not present

## 2023-06-12 MED ORDER — ERYTHROMYCIN 5 MG/GM OP OINT: TOPICAL_OINTMENT | OPHTHALMIC | 0 refills | Status: AC

## 2023-06-12 MED ORDER — CETIRIZINE HCL 10 MG PO TABS: 10.0000 mg | ORAL_TABLET | Freq: Every day | ORAL | 2 refills | Status: AC

## 2023-06-12 NOTE — ED Provider Notes (Signed)
 [facility]    CSN: [id number] Arrival date & time: 06/12/23  1210      History   Chief Complaint Chief Complaint  Patient presents with   Eye Problem    HPI Kristen [name] is a 33 y.o. female.  Here with left eyelid swelling and itching that started this morning Not having pain in eyelid, or eye. No pain with eye movement Denies discharge or drainage No vision changes No fevers Denies foreign body sensation No interventions yet   Past Medical History:  Diagnosis Date   Bacterial vaginitis 07/01/2016   Diabetes mellitus without complication (HCC)    Gestational diabetes 12/15/2021   [facility] Guidelines for Antenatal Testing and Sonography                                                         Updated  02/05/2021 with Dr. Noralee Space     INDICATION  Growth U/S  BPP weekly  DELIVERY RECOMMENDATION (GA)  Diabetes    A1 - good control        A2 - good control        A2  - poor control or poor compliance     (Macrosomia or polyhydramnios)        A2/B and B-C Pregestational Type I   Medical history non-contributory     Patient Active Problem List   Diagnosis Date Noted   Acid reflux 12/28/2021   Sickle cell trait (HCC) 04/26/2016    Past Surgical History:  Procedure Laterality Date   NO PAST SURGERIES      OB History     Gravida  3   Para  3   Term  3   Preterm  0   AB  0   Living  3      SAB  0   IAB  0   Ectopic  0   Multiple  0   Live Births  3            Home Medications    Prior to Admission medications   Medication Sig Start Date End Date Taking? Authorizing Provider  cetirizine (ZYRTEC ALLERGY) 10 MG tablet Take 1 tablet (10 mg total) by mouth daily. 06/12/23  Yes Sigrid Schwebach, Lurena Joiner, PA-C  erythromycin ophthalmic ointment Apply a small amount twice daily to the lashes of the upper lid 06/12/23  Yes Chariti Havel, Lurena Joiner, PA-C  acetaminophen (TYLENOL) 325 MG tablet Take 2 tablets (650 mg total) by mouth every 4 (four) hours as  needed (for pain scale < 4). Patient not taking: Reported on 04/05/2022 02/25/22   Mercado-Ortiz, Lahoma Crocker, DO  benzocaine-Menthol (DERMOPLAST) 20-0.5 % AERO Apply 1 Application topically as needed for irritation (perineal discomfort). Patient not taking: Reported on 04/05/2022 02/25/22   Mercado-Ortiz, Lahoma Crocker, DO  furosemide (LASIX) 20 MG tablet Take 1 tablet (20 mg total) by mouth daily for 3 days. 02/26/22 03/01/22  Mercado-Ortiz, Lahoma Crocker, DO  ibuprofen (ADVIL) 600 MG tablet Take 1 tablet (600 mg total) by mouth every 6 (six) hours. Patient not taking: Reported on 04/05/2022 02/25/22   Myrtie Hawk, DO  medroxyPROGESTERone Acetate 150 MG/ML SUSY Inject 1 mL (150 mg total) into the muscle every 3 (three) months. Patient not taking: Reported on 05/26/2023 04/05/22   Gerrit Heck, CNM  medroxyPROGESTERone Acetate 150 MG/ML SUSY Inject 1 mL (150 mg total) into the muscle every 3 (three) months. 03/02/23   Levie Heritage, DO  NIFEdipine (ADALAT CC) 30 MG 24 hr tablet Take 1 tablet (30 mg total) by mouth daily. Patient not taking: Reported on 04/05/2022 02/26/22   Myrtie Hawk, DO  Prenatal Vit-Fe Fumarate-FA (MULTIVITAMIN-PRENATAL) 27-0.8 MG TABS tablet Take 1 tablet by mouth daily at 12 noon. Patient not taking: Reported on 06/28/2022    [provider]  senna-docusate (SENOKOT-S) 8.6-50 MG tablet Take 2 tablets by mouth daily. Patient not taking: Reported on 03/03/2022 02/26/22   Mercado-Ortiz, Lahoma Crocker, DO    Family History Family History  Problem Relation Age of Onset   Heart disease Father    Asthma Neg Hx    Birth defects Neg Hx    Cancer Neg Hx    Diabetes Neg Hx    Hypertension Neg Hx    Stroke Neg Hx     Social History Social History   Tobacco Use   Smoking status: Never   Smokeless tobacco: Never  Vaping Use   Vaping status: Never Used  Substance Use Topics   Alcohol use: Not Currently    Comment: not while preg   Drug use: No      Allergies   Patient has no known allergies.   Review of Systems Review of Systems Per HPI  Physical Exam Triage Vital Signs ED Triage Vitals [06/12/23 1228]  Encounter Vitals Group     BP (!) 150/74     Systolic BP Percentile      Diastolic BP Percentile      Pulse Rate 80     Resp 18     Temp 98 F (36.7 C)     Temp Source Oral     SpO2 97 %     Weight      Height      Head Circumference      Peak Flow      Pain Score 0     Pain Loc      Pain Education      Exclude from Growth Chart    No data found.  Updated Vital Signs BP (!) 150/74 (BP Location: Right Arm)   Pulse 80   Temp 98 F (36.7 C) (Oral)   Resp 18   SpO2 97%   Physical Exam Vitals and nursing note reviewed.  Constitutional:      General: She is not in acute distress.    Appearance: She is not ill-appearing.  HENT:     Mouth/Throat:     Mouth: Mucous membranes are moist.     Pharynx: Oropharynx is clear.  Eyes:     General: Lids are everted, no foreign bodies appreciated. Vision grossly intact. Gaze aligned appropriately.        Left eye: No foreign body, discharge or hordeolum.     Extraocular Movements: Extraocular movements intact.     Left eye: Normal extraocular motion and no nystagmus.     Conjunctiva/sclera:     Left eye: Left conjunctiva is not injected.     Pupils: Pupils are equal, round, and reactive to light.     Comments: Swelling of left upper eyelid without tenderness or erythema. Slight dryness along lash line. No pain with EOM, proptosis, discharge or drainage   Cardiovascular:     Rate and Rhythm: Normal rate and regular rhythm.     Heart sounds: Normal heart sounds.  Pulmonary:     Effort: Pulmonary effort is normal.     Breath sounds: Normal breath sounds.  Musculoskeletal:     Cervical back: Normal range of motion.  Skin:    General: Skin is warm and dry.  Neurological:     Mental Status: She is alert and oriented to person, place, and time.     UC  Treatments / Results  Labs (all labs ordered are listed, but only abnormal results are displayed) Labs Reviewed - No data to display  EKG  Radiology No results found.  Procedures Procedures (including critical care time)  Medications Ordered in UC Medications - No data to display  Initial Impression / Assessment and Plan / UC Course  I have reviewed the triage vital signs and the nursing notes.  Pertinent labs & imaging results that were available during my care of the patient were reviewed by me and considered in my medical decision making (see chart for details).  Blepharitis Advised warm compress, can take zyrtec daily benadryl nightly for itching and swelling. Also try erythromycin ointment on the lash line. Advised return precautions for signs of preseptal cellulitis -- I have low concern for this type of infection at this time. Patient agrees to plan, no questions   Final Clinical Impressions(s) / UC Diagnoses   Final diagnoses:  Blepharitis of left upper eyelid, unspecified type     Discharge Instructions      Read attached information on blepharitis  Apply warm compress several times daily Take zyrtec once daily, and can also use benadryl at night for itch/swelling Erythromycin ointment can be applied twice daily to outer eyelid at lash line Please return with any new symptoms including worsening pain or swelling     ED Prescriptions     Medication Sig Dispense Auth. Provider   erythromycin ophthalmic ointment Apply a small amount twice daily to the lashes of the upper lid 3.5 g June Vacha, PA-C   cetirizine (ZYRTEC ALLERGY) 10 MG tablet Take 1 tablet (10 mg total) by mouth daily. 30 tablet Dorisann Schwanke, Ivette Marks, PA-C      PDMP not reviewed this encounter.   Creighton Doffing, [place] 06/12/23 1319

## 2023-06-12 NOTE — Discharge Instructions (Addendum)
 Read attached information on blepharitis  Apply warm compress several times daily Take zyrtec once daily, and can also use benadryl at night for itch/swelling Erythromycin ointment can be applied twice daily to outer eyelid at lash line Please return with any new symptoms including worsening pain or swelling

## 2023-06-12 NOTE — ED Triage Notes (Signed)
 Pt reports left eye swollen since this morning.

## 2023-08-10 ENCOUNTER — Other Ambulatory Visit: Payer: Self-pay

## 2023-08-14 ENCOUNTER — Ambulatory Visit

## 2023-08-14 DIAGNOSIS — Z3042 Encounter for surveillance of injectable contraceptive: Secondary | ICD-10-CM

## 2023-08-14 MED ORDER — MEDROXYPROGESTERONE ACETATE 150 MG/ML IM SUSY
150.0000 mg | PREFILLED_SYRINGE | Freq: Once | INTRAMUSCULAR | Status: AC
  Administered 2023-08-14: 150 mg via INTRAMUSCULAR

## 2023-08-14 NOTE — Progress Notes (Signed)
 Date last pap: 08/24/2021. Last Depo-Provera : 05/26/2023. Side Effects if any: NA. Serum HCG indicated? NA. Depo-Provera  150 mg IM given by: Rolfe Cliff CMA. Next appointment due 10/30/2023  Arrie Lares .

## 2023-10-30 ENCOUNTER — Ambulatory Visit

## 2023-11-06 ENCOUNTER — Ambulatory Visit

## 2023-11-06 ENCOUNTER — Other Ambulatory Visit: Payer: Self-pay

## 2023-11-06 ENCOUNTER — Other Ambulatory Visit (HOSPITAL_BASED_OUTPATIENT_CLINIC_OR_DEPARTMENT_OTHER): Payer: Self-pay

## 2023-11-06 VITALS — BP 133/79 | HR 92 | Ht 65.0 in | Wt 236.0 lb

## 2023-11-06 DIAGNOSIS — Z3202 Encounter for pregnancy test, result negative: Secondary | ICD-10-CM | POA: Diagnosis not present

## 2023-11-06 DIAGNOSIS — Z3042 Encounter for surveillance of injectable contraceptive: Secondary | ICD-10-CM | POA: Diagnosis not present

## 2023-11-06 LAB — POCT URINE PREGNANCY: Preg Test, Ur: NEGATIVE

## 2023-11-06 MED ORDER — MEDROXYPROGESTERONE ACETATE 150 MG/ML IM SUSY
150.0000 mg | PREFILLED_SYRINGE | Freq: Once | INTRAMUSCULAR | Status: AC
  Administered 2023-11-06: 150 mg via INTRAMUSCULAR

## 2023-11-06 MED ORDER — MEDROXYPROGESTERONE ACETATE 150 MG/ML IM SUSY
150.0000 mg | PREFILLED_SYRINGE | INTRAMUSCULAR | 0 refills | Status: AC
  Filled 2023-11-06: qty 1, 90d supply, fill #0

## 2023-11-06 NOTE — Progress Notes (Signed)
 Date last pap: 08/24/2021. Last Depo-Provera : 08/14/2023. Side Effects if any: NA. Serum HCG indicated? No. Depo-Provera  150 mg IM given by: [facility] RN. Next appointment due 01/22/24-02/05/2024.

## 2024-01-23 ENCOUNTER — Ambulatory Visit

## 2024-02-05 ENCOUNTER — Other Ambulatory Visit: Payer: Self-pay

## 2024-02-08 ENCOUNTER — Ambulatory Visit

## 2024-02-08 VITALS — BP 133/84 | HR 87 | Ht 65.0 in | Wt 241.1 lb

## 2024-02-08 DIAGNOSIS — Z3042 Encounter for surveillance of injectable contraceptive: Secondary | ICD-10-CM

## 2024-02-08 MED ORDER — MEDROXYPROGESTERONE ACETATE 150 MG/ML IM SUSP
150.0000 mg | Freq: Once | INTRAMUSCULAR | Status: AC
  Administered 2024-02-08: 150 mg via INTRAMUSCULAR

## 2024-02-08 NOTE — Progress Notes (Signed)
 Date last pap: 08/24/2021. Last Depo-Provera : 11/06/2023 Side Effects if any: na. Serum HCG indicated? na. Depo-Provera  150 mg IM given by: Shawnee Fleet, CMA . Next appointment due 03/29/2024 with annual

## 2024-03-28 ENCOUNTER — Ambulatory Visit

## 2024-03-29 ENCOUNTER — Ambulatory Visit

## 2024-03-29 ENCOUNTER — Other Ambulatory Visit (HOSPITAL_COMMUNITY): Admission: RE | Admit: 2024-03-29 | Discharge: 2024-03-29 | Disposition: A | Source: Ambulatory Visit

## 2024-03-29 VITALS — BP 122/74 | HR 84 | Ht 65.0 in | Wt 236.1 lb

## 2024-03-29 DIAGNOSIS — Z01419 Encounter for gynecological examination (general) (routine) without abnormal findings: Secondary | ICD-10-CM | POA: Insufficient documentation

## 2024-03-29 DIAGNOSIS — Z113 Encounter for screening for infections with a predominantly sexual mode of transmission: Secondary | ICD-10-CM | POA: Insufficient documentation

## 2024-03-29 DIAGNOSIS — Z124 Encounter for screening for malignant neoplasm of cervix: Secondary | ICD-10-CM | POA: Insufficient documentation

## 2024-03-29 DIAGNOSIS — Z3042 Encounter for surveillance of injectable contraceptive: Secondary | ICD-10-CM

## 2024-03-29 DIAGNOSIS — Z1239 Encounter for other screening for malignant neoplasm of breast: Secondary | ICD-10-CM

## 2024-03-29 LAB — CERVICOVAGINAL ANCILLARY ONLY
Chlamydia: NEGATIVE
Comment: NEGATIVE
Comment: NEGATIVE
Comment: NORMAL
Neisseria Gonorrhea: NEGATIVE
Trichomonas: NEGATIVE

## 2024-03-29 NOTE — Patient Instructions (Addendum)
 Kristen Bohr, NP [facility] [address] [place], [place] [address] [phone]  Kristen Single, MD [facility] [address] [place], [place] [address] [phone]  Kristen Brought, MD [facility] [place] [address] [place], [place] [address] [phone]

## 2024-03-29 NOTE — Progress Notes (Unsigned)
 "   GYNECOLOGY OFFICE VISIT NOTE-WELL WOMAN EXAM  History:    Kristen [name] is a 34 year old here today for annual exams.  Birth Control:  Depo Provera ; Satisfied  Reproductive Concerns Sexually Active: Yes Partners Type: Males Number of partners in last year: One STD Testing: Full  Obstetrical History: [id number] Vaginal; No problems. Unsure if wanting more children. Maybe one more.  Gynecological History: No h/o abnormal paps or gyn surgeries.  Vaginal/GU Concerns: No vaginal concerns. No bleeding.  Amenorrheic d/t depo.  No issues with urination, constipation, or diarrhea.   Breast Concerns/Exams: Checks breast daily. No concerns. She endorses SBA.  Reports paternal grandma with breast cancer. She is not living, but patient unsure if it was d/t breast cancer. She denies family history of breast, uterine, cervical, or ovarian cancer  Medical and Nutrition PCP: None Significant PMx: GHTN, GDM Exercise: None;  Tobacco/Drugs/Alcohol/Vaping: None Nutrition: Endorses balanced intake.   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  DV/A: Denies Social Support: Endorses Employment:None currently, but typically works from home.   Past Medical History:  Diagnosis Date   Bacterial vaginitis 07/01/2016   Diabetes mellitus without complication (HCC)    Gestational diabetes 12/15/2021   [facility] Guidelines for Antenatal Testing and Sonography                                                         Updated  02/05/2021 with Dr. Fredia Fresh     INDICATION  Growth U/S  BPP weekly  DELIVERY RECOMMENDATION (GA)  Diabetes    A1 - good control        A2 - good control        A2  - poor control or poor compliance     (Macrosomia or polyhydramnios)        A2/B and B-C Pregestational Type I   Medical history non-contributory     Past Surgical History:  Procedure Laterality Date   NO PAST SURGERIES      The following portions of the patient's history were  reviewed and updated as appropriate: allergies, current medications, past family history, past medical history, past social history, past surgical history and problem list.   Health Maintenance: Pap: June 2023 , NILM Results.  Mammogram: Initiate at 34 y.o. or earlier as clinically indicated.  .  Colonoscopy: Initiate at 34 y.o. or earlier as clinically indicated.       12/22/2021    4:00 PM 12/14/2021    9:21 AM 08/24/2021    9:36 AM  Depression screen PHQ 2/9  Decreased Interest 0 0 1  Down, Depressed, Hopeless 0 0 0  PHQ - 2 Score 0 0 1  Altered sleeping  0 0  Tired, decreased energy  0 1  Change in appetite  0 0  Feeling bad or failure about yourself   0 0  Trouble concentrating  0 0  Moving slowly or fidgety/restless  0 0  Suicidal thoughts  0 0  PHQ-9 Score  0  2      Data saved with a previous flowsheet row definition     Review of Systems:  Pertinent items noted in HPI and remainder of comprehensive ROS otherwise negative.     Objective:     Physical Exam BP  122/74 (BP Location: Left Arm, Patient Position: Sitting, Cuff Size: Large)   Pulse 84   Ht 5' 5 (1.651 m)   Wt 236 lb 1.3 oz (107.1 kg)   LMP  (LMP Unknown)   BMI 39.29 kg/m  OBGyn Exam   Labs and Imaging No results found for this or any previous visit (from the past week). No results found.    Assessment & Plan:  *** year old Female Well Woman Exam *** *** ***  1. Well woman exam with routine gynecological exam (Primary) ***  2. Papanicolaou smear ***  3. Encounter for screening breast examination ***  4. Screening examination for STD (sexually transmitted disease) ***   Routine preventative health maintenance measures emphasized. Please refer to After Visit Summary for other counseling recommendations.   No follow-ups on file.      [name], CNM 03/29/2024      "

## 2024-03-30 ENCOUNTER — Ambulatory Visit: Payer: Self-pay

## 2024-03-30 LAB — RPR+HBSAG+HCVAB+...
HIV Screen 4th Generation wRfx: NONREACTIVE
Hep C Virus Ab: NONREACTIVE
Hepatitis B Surface Ag: NEGATIVE
RPR Ser Ql: NONREACTIVE

## 2024-04-01 LAB — CYTOLOGY - PAP
Comment: NEGATIVE
Diagnosis: NEGATIVE
High risk HPV: NEGATIVE

## 2024-04-25 ENCOUNTER — Ambulatory Visit
